# Patient Record
Sex: Female | Born: 1970 | Race: White | Hispanic: No | Marital: Married | Smoking: Former smoker
Health system: Southern US, Community
[De-identification: ages and names within clinical notes are randomized; demographics above are authoritative.]

## PROBLEM LIST (undated history)

## (undated) DIAGNOSIS — J302 Other seasonal allergic rhinitis: Secondary | ICD-10-CM

## (undated) DIAGNOSIS — F909 Attention-deficit hyperactivity disorder, unspecified type: Secondary | ICD-10-CM

## (undated) DIAGNOSIS — F419 Anxiety disorder, unspecified: Secondary | ICD-10-CM

## (undated) DIAGNOSIS — J45909 Unspecified asthma, uncomplicated: Secondary | ICD-10-CM

## (undated) HISTORY — PX: FOOT SURGERY: SHX648

---

## 1998-11-07 ENCOUNTER — Other Ambulatory Visit: Admission: RE | Admit: 1998-11-07 | Discharge: 1998-11-07 | Payer: Self-pay | Admitting: Gynecology

## 2001-04-21 ENCOUNTER — Other Ambulatory Visit: Admission: RE | Admit: 2001-04-21 | Discharge: 2001-04-21 | Payer: Self-pay | Admitting: Gynecology

## 2004-08-28 ENCOUNTER — Ambulatory Visit: Payer: Self-pay | Admitting: Internal Medicine

## 2005-10-28 ENCOUNTER — Ambulatory Visit: Payer: Self-pay | Admitting: Obstetrics and Gynecology

## 2006-04-09 ENCOUNTER — Observation Stay: Payer: Self-pay | Admitting: Obstetrics and Gynecology

## 2006-07-14 ENCOUNTER — Observation Stay: Payer: Self-pay | Admitting: Certified Nurse Midwife

## 2006-07-19 ENCOUNTER — Observation Stay: Payer: Self-pay

## 2006-07-27 ENCOUNTER — Observation Stay: Payer: Self-pay | Admitting: Obstetrics and Gynecology

## 2006-07-27 ENCOUNTER — Inpatient Hospital Stay: Payer: Self-pay | Admitting: Obstetrics and Gynecology

## 2008-02-04 ENCOUNTER — Ambulatory Visit: Payer: Self-pay | Admitting: Obstetrics and Gynecology

## 2009-04-27 ENCOUNTER — Ambulatory Visit: Payer: Self-pay | Admitting: Otolaryngology

## 2009-06-16 ENCOUNTER — Ambulatory Visit: Payer: Self-pay | Admitting: Unknown Physician Specialty

## 2009-06-20 ENCOUNTER — Ambulatory Visit: Payer: Self-pay | Admitting: Unknown Physician Specialty

## 2013-05-31 ENCOUNTER — Other Ambulatory Visit: Payer: Self-pay | Admitting: Orthopaedic Surgery

## 2013-05-31 DIAGNOSIS — M25552 Pain in left hip: Secondary | ICD-10-CM

## 2013-06-09 ENCOUNTER — Other Ambulatory Visit (HOSPITAL_COMMUNITY): Payer: Self-pay | Admitting: Orthopaedic Surgery

## 2013-06-09 ENCOUNTER — Ambulatory Visit (HOSPITAL_COMMUNITY): Admission: RE | Admit: 2013-06-09 | Payer: BC Managed Care – PPO | Source: Ambulatory Visit

## 2013-06-09 ENCOUNTER — Ambulatory Visit (HOSPITAL_COMMUNITY)
Admission: RE | Admit: 2013-06-09 | Discharge: 2013-06-09 | Disposition: A | Discharge status: Home / Self Care | Payer: BC Managed Care – PPO | Source: Ambulatory Visit | Attending: Orthopaedic Surgery | Admitting: Orthopaedic Surgery

## 2013-06-09 DIAGNOSIS — M25559 Pain in unspecified hip: Secondary | ICD-10-CM | POA: Insufficient documentation

## 2013-06-09 DIAGNOSIS — R52 Pain, unspecified: Secondary | ICD-10-CM

## 2013-06-09 DIAGNOSIS — M25552 Pain in left hip: Secondary | ICD-10-CM

## 2013-06-09 MED ORDER — IOHEXOL 300 MG/ML  SOLN
50.0000 mL | Freq: Once | INTRAMUSCULAR | Status: AC | PRN
  Administered 2013-06-09: 9 mL via INTRA_ARTICULAR

## 2013-06-10 ENCOUNTER — Other Ambulatory Visit: Payer: Self-pay

## 2014-06-09 ENCOUNTER — Ambulatory Visit: Payer: Self-pay | Admitting: Obstetrics and Gynecology

## 2014-06-16 ENCOUNTER — Ambulatory Visit: Payer: Self-pay | Admitting: Obstetrics and Gynecology

## 2015-11-22 DIAGNOSIS — F3342 Major depressive disorder, recurrent, in full remission: Secondary | ICD-10-CM | POA: Diagnosis not present

## 2015-11-22 DIAGNOSIS — F900 Attention-deficit hyperactivity disorder, predominantly inattentive type: Secondary | ICD-10-CM | POA: Diagnosis not present

## 2016-01-01 DIAGNOSIS — R030 Elevated blood-pressure reading, without diagnosis of hypertension: Secondary | ICD-10-CM | POA: Diagnosis not present

## 2016-01-01 DIAGNOSIS — J018 Other acute sinusitis: Secondary | ICD-10-CM | POA: Diagnosis not present

## 2016-01-01 DIAGNOSIS — R05 Cough: Secondary | ICD-10-CM | POA: Diagnosis not present

## 2016-01-08 DIAGNOSIS — R197 Diarrhea, unspecified: Secondary | ICD-10-CM | POA: Diagnosis not present

## 2016-01-22 DIAGNOSIS — R197 Diarrhea, unspecified: Secondary | ICD-10-CM | POA: Diagnosis not present

## 2016-02-07 DIAGNOSIS — K580 Irritable bowel syndrome with diarrhea: Secondary | ICD-10-CM | POA: Diagnosis not present

## 2016-02-22 DIAGNOSIS — R197 Diarrhea, unspecified: Secondary | ICD-10-CM | POA: Diagnosis not present

## 2016-02-22 DIAGNOSIS — K52832 Lymphocytic colitis: Secondary | ICD-10-CM | POA: Diagnosis not present

## 2016-02-27 DIAGNOSIS — H5213 Myopia, bilateral: Secondary | ICD-10-CM | POA: Diagnosis not present

## 2016-03-06 DIAGNOSIS — K5289 Other specified noninfective gastroenteritis and colitis: Secondary | ICD-10-CM | POA: Diagnosis not present

## 2016-04-02 DIAGNOSIS — K5289 Other specified noninfective gastroenteritis and colitis: Secondary | ICD-10-CM | POA: Diagnosis not present

## 2016-04-23 ENCOUNTER — Other Ambulatory Visit: Payer: Self-pay | Admitting: Gastroenterology

## 2016-04-23 DIAGNOSIS — K529 Noninfective gastroenteritis and colitis, unspecified: Secondary | ICD-10-CM

## 2016-05-01 ENCOUNTER — Ambulatory Visit
Admission: RE | Admit: 2016-05-01 | Discharge: 2016-05-01 | Disposition: A | Discharge status: Home / Self Care | Payer: BLUE CROSS/BLUE SHIELD | Source: Ambulatory Visit | Attending: Gastroenterology | Admitting: Gastroenterology

## 2016-05-01 DIAGNOSIS — K529 Noninfective gastroenteritis and colitis, unspecified: Secondary | ICD-10-CM | POA: Diagnosis not present

## 2016-05-02 DIAGNOSIS — Z23 Encounter for immunization: Secondary | ICD-10-CM | POA: Diagnosis not present

## 2016-05-08 DIAGNOSIS — K52832 Lymphocytic colitis: Secondary | ICD-10-CM | POA: Diagnosis not present

## 2016-05-22 DIAGNOSIS — F411 Generalized anxiety disorder: Secondary | ICD-10-CM | POA: Diagnosis not present

## 2016-05-22 DIAGNOSIS — F900 Attention-deficit hyperactivity disorder, predominantly inattentive type: Secondary | ICD-10-CM | POA: Diagnosis not present

## 2016-05-27 ENCOUNTER — Other Ambulatory Visit: Payer: Self-pay | Admitting: Obstetrics and Gynecology

## 2016-05-27 DIAGNOSIS — Z1231 Encounter for screening mammogram for malignant neoplasm of breast: Secondary | ICD-10-CM

## 2016-05-27 DIAGNOSIS — N920 Excessive and frequent menstruation with regular cycle: Secondary | ICD-10-CM | POA: Diagnosis not present

## 2016-05-27 DIAGNOSIS — Z01411 Encounter for gynecological examination (general) (routine) with abnormal findings: Secondary | ICD-10-CM | POA: Diagnosis not present

## 2016-05-27 DIAGNOSIS — N750 Cyst of Bartholin's gland: Secondary | ICD-10-CM | POA: Diagnosis not present

## 2016-06-05 ENCOUNTER — Ambulatory Visit
Admission: RE | Admit: 2016-06-05 | Discharge: 2016-06-05 | Disposition: A | Discharge status: Home / Self Care | Payer: BLUE CROSS/BLUE SHIELD | Source: Ambulatory Visit | Attending: Obstetrics and Gynecology | Admitting: Obstetrics and Gynecology

## 2016-06-05 ENCOUNTER — Other Ambulatory Visit: Payer: Self-pay | Admitting: Obstetrics and Gynecology

## 2016-06-05 DIAGNOSIS — Z1231 Encounter for screening mammogram for malignant neoplasm of breast: Secondary | ICD-10-CM

## 2016-06-18 DIAGNOSIS — N920 Excessive and frequent menstruation with regular cycle: Secondary | ICD-10-CM | POA: Diagnosis not present

## 2016-10-24 DIAGNOSIS — J069 Acute upper respiratory infection, unspecified: Secondary | ICD-10-CM | POA: Diagnosis not present

## 2016-10-24 DIAGNOSIS — R6889 Other general symptoms and signs: Secondary | ICD-10-CM | POA: Diagnosis not present

## 2016-10-30 ENCOUNTER — Ambulatory Visit (INDEPENDENT_AMBULATORY_CARE_PROVIDER_SITE_OTHER): Payer: BLUE CROSS/BLUE SHIELD | Admitting: Physician Assistant

## 2016-10-30 ENCOUNTER — Ambulatory Visit (INDEPENDENT_AMBULATORY_CARE_PROVIDER_SITE_OTHER): Payer: BLUE CROSS/BLUE SHIELD

## 2016-10-30 DIAGNOSIS — M79644 Pain in right finger(s): Secondary | ICD-10-CM

## 2016-10-30 DIAGNOSIS — M79645 Pain in left finger(s): Secondary | ICD-10-CM

## 2016-10-30 MED ORDER — DICLOFENAC SODIUM 2 % TD SOLN: TRANSDERMAL | 1 refills | Status: DC

## 2016-10-30 NOTE — Progress Notes (Signed)
   Office Visit Note   Patient: Brittney Alvarez           Date of Birth: 23-Aug-1970           MRN: [id number] Visit Date: 10/30/2016              Requested by: Brittney Haddock, NP [place], [place] [address] PCP: Brittney Haddock, NP   Assessment & Plan: Visit Diagnoses:  1. Pain in left finger(s)     Plan: We will have her apply NSAID to the finger up to 2 mg 4 times a day. I'll up if pain persists or becomes worse.  Follow-Up Instructions: Return if symptoms worsen or fail to improve.   Orders:  Orders Placed This Encounter  Procedures  . XR Finger Middle Right   Meds ordered this encounter  Medications  . Diclofenac Sodium (PENNSAID) 2 % SOLN    Sig: One pump to affected area three times a day    Dispense:  1 Bottle    Refill:  1      Procedures: No procedures performed   Clinical Data: No additional findings.   Subjective: Right middle finger pain  Mrs. Brittney Alvarez comes in today with the aid complaint of right middle finger pain. No injury pains been ongoing for last week or so. She noted some swelling in the finger. She's having no triggering finger. She is recovering from the flu nice any current fevers chills body aches other than the right middle finger  Review of Systems   Objective: Vital Signs: There were no vitals taken for this visit.  Physical Exam  Constitutional: She is oriented to person, place, and time. She appears well-developed and well-nourished. No distress.  Neurological: She is alert and oriented to person, place, and time.    Ortho Exam  bilateral hands she has full motor and full sensation. Radial pulses are 2+ and equal and symmetric. No rashes skin lesions ulcerations erythema or ecchymosis of either hand. Slight edema of the right middle finger at the PIP joint region. Tenderness over the left at the dorsal aspect of the middle finger from the PIP joint to the DIP joint. She has full  motion through the DIP joint and PIP joint of the middle  finger without pain. There is no active triggering of the finger. Remainder of the hand is nontender. No palpable cyst of the right middle finger. No obvious deformity of the right middle finger.  Specialty Comments:  No specialty comments available.  Imaging: Xr Finger Middle Right  Result Date: 10/30/2016 3 views of the right middle finger: No acute fracture. The joint spaces are all well maintained. No foreign body or bony abnormalities seen.    PMFS History: There are no active problems to display for this patient.  No past medical history on file.  No family history on file.  No past surgical history on file. Social History   Occupational History  . Not on file.   Social History Main Topics  . Smoking status: Not on file  . Smokeless tobacco: Not on file  . Alcohol use Not on file  . Drug use: Unknown  . Sexual activity: Not on file

## 2017-01-07 DIAGNOSIS — L820 Inflamed seborrheic keratosis: Secondary | ICD-10-CM | POA: Diagnosis not present

## 2017-01-07 DIAGNOSIS — L814 Other melanin hyperpigmentation: Secondary | ICD-10-CM | POA: Diagnosis not present

## 2017-01-07 DIAGNOSIS — L821 Other seborrheic keratosis: Secondary | ICD-10-CM | POA: Diagnosis not present

## 2017-01-07 DIAGNOSIS — D225 Melanocytic nevi of trunk: Secondary | ICD-10-CM | POA: Diagnosis not present

## 2017-01-07 DIAGNOSIS — L2084 Intrinsic (allergic) eczema: Secondary | ICD-10-CM | POA: Diagnosis not present

## 2017-02-26 DIAGNOSIS — F411 Generalized anxiety disorder: Secondary | ICD-10-CM | POA: Diagnosis not present

## 2017-02-26 DIAGNOSIS — F900 Attention-deficit hyperactivity disorder, predominantly inattentive type: Secondary | ICD-10-CM | POA: Diagnosis not present

## 2017-03-10 DIAGNOSIS — D235 Other benign neoplasm of skin of trunk: Secondary | ICD-10-CM | POA: Diagnosis not present

## 2017-03-10 DIAGNOSIS — D225 Melanocytic nevi of trunk: Secondary | ICD-10-CM | POA: Diagnosis not present

## 2017-03-28 ENCOUNTER — Encounter: Payer: Self-pay | Admitting: Podiatry

## 2017-03-28 ENCOUNTER — Ambulatory Visit (INDEPENDENT_AMBULATORY_CARE_PROVIDER_SITE_OTHER): Payer: BLUE CROSS/BLUE SHIELD | Admitting: Podiatry

## 2017-03-28 ENCOUNTER — Ambulatory Visit (INDEPENDENT_AMBULATORY_CARE_PROVIDER_SITE_OTHER): Payer: BLUE CROSS/BLUE SHIELD

## 2017-03-28 DIAGNOSIS — M722 Plantar fascial fibromatosis: Secondary | ICD-10-CM

## 2017-03-28 DIAGNOSIS — M779 Enthesopathy, unspecified: Secondary | ICD-10-CM | POA: Diagnosis not present

## 2017-03-28 MED ORDER — DICLOFENAC SODIUM 75 MG PO TBEC: 75.0000 mg | DELAYED_RELEASE_TABLET | Freq: Two times a day (BID) | ORAL | 0 refills | Status: DC

## 2017-03-28 MED ORDER — TRIAMCINOLONE ACETONIDE 10 MG/ML IJ SUSP
10.0000 mg | Freq: Once | INTRAMUSCULAR | Status: AC
  Administered 2017-03-28: 10 mg

## 2017-03-28 MED ORDER — DICLOFENAC SODIUM 75 MG PO TBEC: 75.0000 mg | DELAYED_RELEASE_TABLET | Freq: Two times a day (BID) | ORAL | 2 refills | Status: DC

## 2017-03-28 MED ORDER — TRIAMCINOLONE ACETONIDE 10 MG/ML IJ SUSP: 10.0000 mg | Freq: Once | INTRAMUSCULAR | Status: DC

## 2017-03-30 NOTE — Progress Notes (Signed)
Subjective:    Patient ID: Brittney Alvarez, female   DOB: 46 y.o.   MRN: [id number]   HPI patient presents with pain in the right heel and also in the right ankle. Not sure which started first and it's been going on for short period of time she's had a, mild history of plantar fasciitis in the past.    Review of Systems  All other systems reviewed and are negative.       Objective:  Physical Exam  Constitutional: She appears well-developed and well-nourished.  Cardiovascular: Intact distal pulses.   Musculoskeletal: Normal range of motion.  Neurological: She is alert.  Skin: Skin is warm.  Nursing note and vitals reviewed.  neurovascular status was found to be intact with muscle strength adequate range of motion within normal limits. Patient was noted to have discomfort of an exquisite nature in the right sinus tarsi with inflammation fluid and also moderate discomfort in the plantar heel right and the calcaneal bone itself with minimal edema noted around this area. Patient has good digital perfusion well oriented 3     Assessment:  Possibility for inflammatory fasciitis causing change in gait leading to significant sinus tarsitis right foot. Patient also has a very small chance for calcaneal fracture but does not show significant symptoms of this       Plan:   H&P x-rays reviewed and at this time we'll focus on the sinus tarsi and then we will consider other treatment if the other symptoms persist. I did a sinus tarsi injection 3 mg Kenalog 5 mill grams Xylocaine directly into the subtalar joint I applied fascial brace to immobilize the foot advised on ice therapy placed on diclofenac 75 mg twice a day and reappoint if symptoms were to continue  X-rays were negative for signs of arthritis or spurring or what appears to be any form of calcaneal fracture currently

## 2017-05-03 DIAGNOSIS — Z23 Encounter for immunization: Secondary | ICD-10-CM | POA: Diagnosis not present

## 2017-07-21 ENCOUNTER — Ambulatory Visit: Payer: BLUE CROSS/BLUE SHIELD | Admitting: Podiatry

## 2017-07-21 ENCOUNTER — Ambulatory Visit (INDEPENDENT_AMBULATORY_CARE_PROVIDER_SITE_OTHER): Payer: BLUE CROSS/BLUE SHIELD

## 2017-07-21 ENCOUNTER — Other Ambulatory Visit: Payer: Self-pay | Admitting: Podiatry

## 2017-07-21 ENCOUNTER — Encounter: Payer: Self-pay | Admitting: Podiatry

## 2017-07-21 DIAGNOSIS — M779 Enthesopathy, unspecified: Secondary | ICD-10-CM

## 2017-07-21 DIAGNOSIS — M79672 Pain in left foot: Secondary | ICD-10-CM

## 2017-07-21 MED ORDER — TRIAMCINOLONE ACETONIDE 10 MG/ML IJ SUSP: 10.0000 mg | Freq: Once | INTRAMUSCULAR | Status: DC

## 2017-07-21 NOTE — Progress Notes (Signed)
Subjective:   Patient ID: Brittney Alvarez, female   DOB: 46 y.o.   MRN: [id number]   HPI Patient presents stating she's been getting a lot of pain in her left foot and it's gradually becoming more irritated. States it's been this way for about a week and gradually becoming more of an issue for her and that she has tried wider shoes without relief   ROS      Objective:  Physical Exam  Neurovascular status intact negative Homans sign was noted with patient's left foot showing inflammation around the proximal hallux and just distal to the first MPJ. There is no loss of motion or crepitus noted within the joint     Assessment:  Inflammatory condition with inflammation around the first MPJ medial side     Plan:  H&P condition reviewed and today reviewed x-ray and then carefully injected the medial side 3 mg Kenalog 5 mill grams Xylocaine and applied cushion take pressure off the toe. Reappoint for [place] to recheck  X-ray indicates moderate bunion but no indication of other pathology

## 2017-08-25 DIAGNOSIS — F419 Anxiety disorder, unspecified: Secondary | ICD-10-CM | POA: Diagnosis not present

## 2017-08-25 DIAGNOSIS — Z6829 Body mass index (BMI) 29.0-29.9, adult: Secondary | ICD-10-CM | POA: Diagnosis not present

## 2017-08-25 DIAGNOSIS — R51 Headache: Secondary | ICD-10-CM | POA: Diagnosis not present

## 2017-10-09 ENCOUNTER — Encounter: Payer: Self-pay | Admitting: Neurology

## 2017-10-09 DIAGNOSIS — Z6829 Body mass index (BMI) 29.0-29.9, adult: Secondary | ICD-10-CM | POA: Diagnosis not present

## 2017-10-09 DIAGNOSIS — R51 Headache: Secondary | ICD-10-CM | POA: Diagnosis not present

## 2017-10-13 DIAGNOSIS — R51 Headache: Secondary | ICD-10-CM | POA: Diagnosis not present

## 2017-11-05 DIAGNOSIS — F900 Attention-deficit hyperactivity disorder, predominantly inattentive type: Secondary | ICD-10-CM | POA: Diagnosis not present

## 2017-11-19 ENCOUNTER — Encounter (INDEPENDENT_AMBULATORY_CARE_PROVIDER_SITE_OTHER): Payer: Self-pay | Admitting: Orthopaedic Surgery

## 2017-11-19 ENCOUNTER — Ambulatory Visit (INDEPENDENT_AMBULATORY_CARE_PROVIDER_SITE_OTHER): Payer: BLUE CROSS/BLUE SHIELD | Admitting: Orthopaedic Surgery

## 2017-11-19 DIAGNOSIS — M7711 Lateral epicondylitis, right elbow: Secondary | ICD-10-CM | POA: Diagnosis not present

## 2017-11-19 MED ORDER — LIDOCAINE HCL 1 % IJ SOLN
1.0000 mL | INTRAMUSCULAR | Status: AC | PRN
  Administered 2017-11-19: 1 mL

## 2017-11-19 MED ORDER — DICLOFENAC SODIUM 75 MG PO TBEC: 75.0000 mg | DELAYED_RELEASE_TABLET | Freq: Two times a day (BID) | ORAL | 1 refills | Status: DC | PRN

## 2017-11-19 MED ORDER — METHYLPREDNISOLONE ACETATE 40 MG/ML IJ SUSP
40.0000 mg | INTRAMUSCULAR | Status: AC | PRN
  Administered 2017-11-19: 40 mg

## 2017-11-19 NOTE — Progress Notes (Signed)
Office Visit Note   Patient: Brittney Alvarez           Date of Birth: [birth year]           MRN: [id number] Visit Date: 11/19/2017              Requested by: Kathrine Haddock, NP [address], [place] [address] PCP: Kathrine Haddock, NP   Assessment & Plan: Visit Diagnoses:  1. Right lateral epicondylitis     Plan: Her clinical exam is definitely consistent with lateral epicondylitis of the right elbow.  She is going to try to work on activity modification with avoiding a pronated position for long periods of time.  We will try diclofenac as an anti-inflammatory as well as give her some samples of pennsaid.  Given the severity of her pain we offered her a steroid injection of the lateral epicondyle area and she agreed to this.  She understands the risk and benefits of injections.  She tolerated it well.  Also showed her stretching exercises want to try.  She understands this take a while for it to feel better.  She also has a tennis elbow strap.  All questions concerns were answered and assessed.  She will follow-up as needed.  Follow-Up Instructions: Return if symptoms worsen or fail to improve.   Orders:  Orders Placed This Encounter  Procedures  . Hand/UE Inj   Meds ordered this encounter  Medications  . diclofenac (VOLTAREN) 75 MG EC tablet    Sig: Take 1 tablet (75 mg total) by mouth 2 (two) times daily between meals as needed.    Dispense:  60 tablet    Refill:  1      Procedures: Hand/UE Inj: R elbow for lateral epicondylitis on 11/19/2017 8:47 AM Medications: 1 mL lidocaine 1 %; 40 mg methylPREDNISolone acetate 40 MG/ML      Clinical Data: No additional findings.   Subjective: No chief complaint on file. Patient comes in today with chief complaint of right elbow pain and she points to the lateral epicondyle area source of her elbow.  She says she uses a mouse at work all day long with her hand in a pronated position it causes significant pain.  She is on recently switched  her mouse.  She denies any numbness and tingling in her hand.  She denies any injuries.  She is right-hand dominant.  HPI  Review of Systems She denies any headache, chest pain, shortness of breath, fever, chills, nausea, vomiting.  Objective: Vital Signs: There were no vitals taken for this visit.  Physical Exam She is alert and oriented x3 and in no acute distress Ortho Exam Examination of her right elbow shows fluid and full range of motion of the elbow.  There is no effusion at all.  She has significant pain over the lateral epicondylar area to palpation.  Grip strength is normal in her hand and wrist exam are normal. Specialty Comments:  No specialty comments available.  Imaging: No results found.   PMFS History: Patient Active Problem List   Diagnosis Date Noted  . Right lateral epicondylitis 11/19/2017   History reviewed. No pertinent past medical history.  History reviewed. No pertinent family history.  History reviewed. No pertinent surgical history. Social History   Occupational History  . Not on file  Tobacco Use  . Smoking status: Unknown If Ever Smoked  . Smokeless tobacco: Never Used  Substance and Sexual Activity  . Alcohol use: Not on file  . Drug  use: Not on file  . Sexual activity: Not on file

## 2017-11-20 ENCOUNTER — Other Ambulatory Visit: Payer: Self-pay | Admitting: Unknown Physician Specialty

## 2017-11-20 DIAGNOSIS — R05 Cough: Secondary | ICD-10-CM | POA: Diagnosis not present

## 2017-11-20 DIAGNOSIS — M5481 Occipital neuralgia: Secondary | ICD-10-CM

## 2017-11-20 DIAGNOSIS — R51 Headache: Secondary | ICD-10-CM | POA: Diagnosis not present

## 2017-12-01 ENCOUNTER — Ambulatory Visit: Payer: BLUE CROSS/BLUE SHIELD

## 2017-12-12 ENCOUNTER — Encounter: Payer: Self-pay | Admitting: Neurology

## 2017-12-29 ENCOUNTER — Other Ambulatory Visit: Payer: Self-pay | Admitting: Nurse Practitioner

## 2017-12-29 DIAGNOSIS — M5481 Occipital neuralgia: Secondary | ICD-10-CM | POA: Diagnosis not present

## 2017-12-29 DIAGNOSIS — M542 Cervicalgia: Secondary | ICD-10-CM

## 2017-12-29 DIAGNOSIS — E559 Vitamin D deficiency, unspecified: Secondary | ICD-10-CM | POA: Diagnosis not present

## 2017-12-31 DIAGNOSIS — H5213 Myopia, bilateral: Secondary | ICD-10-CM | POA: Diagnosis not present

## 2017-12-31 DIAGNOSIS — D485 Neoplasm of uncertain behavior of skin: Secondary | ICD-10-CM | POA: Diagnosis not present

## 2018-01-06 DIAGNOSIS — D1801 Hemangioma of skin and subcutaneous tissue: Secondary | ICD-10-CM | POA: Diagnosis not present

## 2018-01-06 DIAGNOSIS — L814 Other melanin hyperpigmentation: Secondary | ICD-10-CM | POA: Diagnosis not present

## 2018-01-06 DIAGNOSIS — L821 Other seborrheic keratosis: Secondary | ICD-10-CM | POA: Diagnosis not present

## 2018-01-06 DIAGNOSIS — D225 Melanocytic nevi of trunk: Secondary | ICD-10-CM | POA: Diagnosis not present

## 2018-01-08 ENCOUNTER — Ambulatory Visit
Admission: RE | Admit: 2018-01-08 | Discharge: 2018-01-08 | Disposition: A | Discharge status: Home / Self Care | Payer: BLUE CROSS/BLUE SHIELD | Source: Ambulatory Visit | Attending: Nurse Practitioner | Admitting: Nurse Practitioner

## 2018-01-08 DIAGNOSIS — M542 Cervicalgia: Secondary | ICD-10-CM | POA: Diagnosis not present

## 2018-01-08 DIAGNOSIS — E559 Vitamin D deficiency, unspecified: Secondary | ICD-10-CM | POA: Diagnosis not present

## 2018-01-08 MED ORDER — GADOBENATE DIMEGLUMINE 529 MG/ML IV SOLN
19.0000 mL | Freq: Once | INTRAVENOUS | Status: AC | PRN
  Administered 2018-01-08: 20 mL via INTRAVENOUS

## 2018-01-27 ENCOUNTER — Ambulatory Visit: Payer: Self-pay | Admitting: Neurology

## 2018-01-30 DIAGNOSIS — M5481 Occipital neuralgia: Secondary | ICD-10-CM | POA: Diagnosis not present

## 2018-01-30 DIAGNOSIS — E559 Vitamin D deficiency, unspecified: Secondary | ICD-10-CM | POA: Diagnosis not present

## 2018-01-30 DIAGNOSIS — M542 Cervicalgia: Secondary | ICD-10-CM | POA: Diagnosis not present

## 2018-02-02 DIAGNOSIS — M5481 Occipital neuralgia: Secondary | ICD-10-CM | POA: Diagnosis not present

## 2018-04-08 DIAGNOSIS — Z1211 Encounter for screening for malignant neoplasm of colon: Secondary | ICD-10-CM | POA: Diagnosis not present

## 2018-04-08 DIAGNOSIS — Z124 Encounter for screening for malignant neoplasm of cervix: Secondary | ICD-10-CM | POA: Diagnosis not present

## 2018-04-08 DIAGNOSIS — Z1239 Encounter for other screening for malignant neoplasm of breast: Secondary | ICD-10-CM | POA: Diagnosis not present

## 2018-04-08 DIAGNOSIS — E669 Obesity, unspecified: Secondary | ICD-10-CM | POA: Diagnosis not present

## 2018-04-08 DIAGNOSIS — Z01419 Encounter for gynecological examination (general) (routine) without abnormal findings: Secondary | ICD-10-CM | POA: Diagnosis not present

## 2018-04-09 DIAGNOSIS — Z01419 Encounter for gynecological examination (general) (routine) without abnormal findings: Secondary | ICD-10-CM | POA: Diagnosis not present

## 2018-04-09 DIAGNOSIS — E669 Obesity, unspecified: Secondary | ICD-10-CM | POA: Diagnosis not present

## 2018-04-29 DIAGNOSIS — F900 Attention-deficit hyperactivity disorder, predominantly inattentive type: Secondary | ICD-10-CM | POA: Diagnosis not present

## 2018-05-04 DIAGNOSIS — M542 Cervicalgia: Secondary | ICD-10-CM | POA: Diagnosis not present

## 2018-05-04 DIAGNOSIS — G4719 Other hypersomnia: Secondary | ICD-10-CM | POA: Diagnosis not present

## 2018-05-04 DIAGNOSIS — M5481 Occipital neuralgia: Secondary | ICD-10-CM | POA: Diagnosis not present

## 2018-05-04 DIAGNOSIS — E559 Vitamin D deficiency, unspecified: Secondary | ICD-10-CM | POA: Diagnosis not present

## 2018-05-15 ENCOUNTER — Encounter: Payer: Self-pay | Admitting: Podiatry

## 2018-05-15 ENCOUNTER — Other Ambulatory Visit: Payer: Self-pay | Admitting: Podiatry

## 2018-05-15 ENCOUNTER — Ambulatory Visit (INDEPENDENT_AMBULATORY_CARE_PROVIDER_SITE_OTHER): Payer: BLUE CROSS/BLUE SHIELD

## 2018-05-15 ENCOUNTER — Ambulatory Visit: Payer: BLUE CROSS/BLUE SHIELD | Admitting: Podiatry

## 2018-05-15 DIAGNOSIS — M779 Enthesopathy, unspecified: Secondary | ICD-10-CM

## 2018-05-15 DIAGNOSIS — M79675 Pain in left toe(s): Secondary | ICD-10-CM

## 2018-05-15 MED ORDER — TRIAMCINOLONE ACETONIDE 10 MG/ML IJ SUSP
10.0000 mg | Freq: Once | INTRAMUSCULAR | Status: AC
  Administered 2018-05-15: 10 mg

## 2018-05-18 NOTE — Progress Notes (Signed)
Subjective:   Patient ID: Brittney Alvarez, female   DOB: 47 y.o.   MRN: [id number]   HPI Patient presents with reoccurrence of pain in the left first metatarsal distal to the metatarsal head base of proximal phalanx medial side that is inflamed and painful when palpated.  It is localized to this area with fluid buildup and appears to be the same problem she had last year   ROS      Objective:  Physical Exam  Neurovascular status intact with inflammation base of left hallux left with fluid buildup and what appears to be a prominence of the bone in this area     Assessment:  Probability for bone spur with inflammatory changes of the base of the left hallux with possibility that there may be nerve entrapment also associated with this     Plan:  H&P x-ray taken and today added careful injection of this area 3 mg Kenalog 5 Milgram Xylocaine to reduce the inflammation.  I explained to her that ultimately this may require surgical intervention but at this point she did well with that conservatively for almost a year so we will follow this and see how it does  X-ray indicates there is some prominence in the area but no indications of out right bone spur formation

## 2018-06-17 DIAGNOSIS — F900 Attention-deficit hyperactivity disorder, predominantly inattentive type: Secondary | ICD-10-CM | POA: Diagnosis not present

## 2018-06-17 DIAGNOSIS — F4322 Adjustment disorder with anxiety: Secondary | ICD-10-CM | POA: Diagnosis not present

## 2018-12-15 DIAGNOSIS — G5630 Lesion of radial nerve, unspecified upper limb: Secondary | ICD-10-CM | POA: Insufficient documentation

## 2019-04-09 ENCOUNTER — Ambulatory Visit (INDEPENDENT_AMBULATORY_CARE_PROVIDER_SITE_OTHER): Payer: 59

## 2019-04-09 ENCOUNTER — Ambulatory Visit (INDEPENDENT_AMBULATORY_CARE_PROVIDER_SITE_OTHER): Payer: 59 | Admitting: Podiatry

## 2019-04-09 ENCOUNTER — Other Ambulatory Visit: Payer: Self-pay

## 2019-04-09 ENCOUNTER — Encounter: Payer: Self-pay | Admitting: Podiatry

## 2019-04-09 ENCOUNTER — Other Ambulatory Visit: Payer: Self-pay | Admitting: Podiatry

## 2019-04-09 VITALS — Temp 98.2°F

## 2019-04-09 DIAGNOSIS — N920 Excessive and frequent menstruation with regular cycle: Secondary | ICD-10-CM | POA: Insufficient documentation

## 2019-04-09 DIAGNOSIS — F988 Other specified behavioral and emotional disorders with onset usually occurring in childhood and adolescence: Secondary | ICD-10-CM | POA: Insufficient documentation

## 2019-04-09 DIAGNOSIS — M21612 Bunion of left foot: Secondary | ICD-10-CM

## 2019-04-09 DIAGNOSIS — M21619 Bunion of unspecified foot: Secondary | ICD-10-CM | POA: Diagnosis not present

## 2019-04-09 DIAGNOSIS — D169 Benign neoplasm of bone and articular cartilage, unspecified: Secondary | ICD-10-CM

## 2019-04-09 DIAGNOSIS — M79675 Pain in left toe(s): Secondary | ICD-10-CM | POA: Diagnosis not present

## 2019-04-09 NOTE — Progress Notes (Signed)
Subjective:   Patient ID: Brittney Alvarez, female   DOB: 48 y.o.   MRN: [id number]   HPI Patient presents stating that this big toe joint is really bother me again and I am getting the same irritation of the nerve and it feels like it is pressing against the bone and all different types of shoes seem to bother it   ROS      Objective:  Physical Exam  Neurovascular status intact with patient found to have moderate prominence of the first metatarsal head left with redness and discomfort and redness which extends slightly distal from this.  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  Organ to try to just smooth the bone and I am relatively confident this will solve her problem and today I went ahead and allowed her to read consent form going over all possible complications as outlined and the fact there is no long-term guarantee of surgery.  Patient wants procedure and at this time signed consent form understanding risk and alternative treatments and is scheduled for outpatient surgery in an air fracture walker dispensed with all instructions on usage and is encouraged to call with questions concerns  X-rays indicate that there is moderate structural bunion deformity left with roughness of the head of the first metatarsal

## 2019-04-09 NOTE — Patient Instructions (Signed)
Pre-Operative Instructions  Congratulations, you have decided to take an important step towards improving your quality of life.  You can be assured that the doctors and staff at [facility] will be with you every step of the way.  Here are some important things you should know:  1. Plan to be at the surgery center/hospital at least 1 (one) hour prior to your scheduled time, unless otherwise directed by the surgical center/hospital staff.  You must have a responsible adult accompany you, remain during the surgery and drive you home.  Make sure you have directions to the surgical center/hospital to ensure you arrive on time. 2. If you are having surgery at [facility] or [facility], you will need a copy of your medical history and physical form from your family physician within one month prior to the date of surgery. We will give you a form for your primary physician to complete.  3. We make every effort to accommodate the date you request for surgery.  However, there are times where surgery dates or times have to be moved.  We will contact you as soon as possible if a change in schedule is required.   4. No aspirin/ibuprofen for one week before surgery.  If you are on aspirin, any non-steroidal anti-inflammatory medications (Mobic, Aleve, Ibuprofen) should not be taken seven (7) days prior to your surgery.  You make take Tylenol for pain prior to surgery.  5. Medications - If you are taking daily heart and blood pressure medications, seizure, reflux, allergy, asthma, anxiety, pain or diabetes medications, make sure you notify the surgery center/hospital before the day of surgery so they can tell you which medications you should take or avoid the day of surgery. 6. No food or drink after midnight the night before surgery unless directed otherwise by surgical center/hospital staff. 7. No alcoholic beverages 24-hours prior to surgery.  No smoking 24-hours prior or 24-hours after  surgery. 8. Wear loose pants or shorts. They should be loose enough to fit over bandages, boots, and casts. 9. Don't wear slip-on shoes. Sneakers are preferred. 10. Bring your boot with you to the surgery center/hospital.  Also bring crutches or a walker if your physician has prescribed it for you.  If you do not have this equipment, it will be provided for you after surgery. 11. If you have not been contacted by the surgery center/hospital by the day before your surgery, call to confirm the date and time of your surgery. 12. Leave-time from work may vary depending on the type of surgery you have.  Appropriate arrangements should be made prior to surgery with your employer. 13. Prescriptions will be provided immediately following surgery by your doctor.  Fill these as soon as possible after surgery and take the medication as directed. Pain medications will not be refilled on weekends and must be approved by the doctor. 14. Remove nail polish on the operative foot and avoid getting pedicures prior to surgery. 15. Wash the night before surgery.  The night before surgery wash the foot and leg well with water and the antibacterial soap provided. Be sure to pay special attention to beneath the toenails and in between the toes.  Wash for at least three (3) minutes. Rinse thoroughly with water and dry well with a towel.  Perform this wash unless told not to do so by your physician.  Enclosed: 1 Ice pack (please put in freezer the night before surgery)   1 Hibiclens skin cleaner     Pre-op instructions  If you have any questions regarding the instructions, please do not hesitate to call our office.  [place]: [address], [place], [place] [address] -- [phone]  [place]: [address] [place], [place] [address] -- [phone]  [place]: [address]  [place], [place] [address] -- [phone]  [place]: [address], [address], [place], [place] [address] -- [phone]  Website:  [email or web address] 

## 2019-04-22 ENCOUNTER — Telehealth: Payer: Self-pay | Admitting: *Deleted

## 2019-04-22 NOTE — Telephone Encounter (Signed)
"  We've been trying to reach [facility].  She's scheduled for Tuesday, September 8.  Do you have a different number for her?"  Try these numbers.  Her emails is [email or web address] Alvarez  [phone]      Beaudry,[name] [phone]

## 2019-04-23 ENCOUNTER — Telehealth: Payer: Self-pay | Admitting: *Deleted

## 2019-04-23 NOTE — Telephone Encounter (Addendum)
DOS 04/27/2019 [facility] - ID:[id number] AND HAMMER TOE REPAIR - [id number] OF THE LEFT FOOT  [employer]: Effective Date - 02/17/2019  Deductible - $3000  Co-insurance - 80% / 20% Out of pocket - $4000  Transferred me to prior authorization dept.  Spoke to [employer]. Pre-certification is NOT REQUIRED. Don't use reference numbers.  Calls regarded by date and time. 04/23/2019 12:26 pm.

## 2019-04-27 ENCOUNTER — Encounter: Payer: Self-pay | Admitting: Podiatry

## 2019-04-27 DIAGNOSIS — M2012 Hallux valgus (acquired), left foot: Secondary | ICD-10-CM

## 2019-04-27 DIAGNOSIS — D1632 Benign neoplasm of short bones of left lower limb: Secondary | ICD-10-CM | POA: Diagnosis not present

## 2019-04-27 DIAGNOSIS — M85672 Other cyst of bone, left ankle and foot: Secondary | ICD-10-CM

## 2019-05-05 ENCOUNTER — Encounter: Payer: Self-pay | Admitting: Podiatry

## 2019-05-05 ENCOUNTER — Other Ambulatory Visit: Payer: Self-pay

## 2019-05-05 ENCOUNTER — Ambulatory Visit (INDEPENDENT_AMBULATORY_CARE_PROVIDER_SITE_OTHER): Payer: 59 | Admitting: Podiatry

## 2019-05-05 ENCOUNTER — Ambulatory Visit (INDEPENDENT_AMBULATORY_CARE_PROVIDER_SITE_OTHER): Payer: 59

## 2019-05-05 VITALS — Temp 97.5°F

## 2019-05-05 DIAGNOSIS — M21619 Bunion of unspecified foot: Secondary | ICD-10-CM

## 2019-05-05 DIAGNOSIS — Z09 Encounter for follow-up examination after completed treatment for conditions other than malignant neoplasm: Secondary | ICD-10-CM

## 2019-05-05 DIAGNOSIS — M21612 Bunion of left foot: Secondary | ICD-10-CM

## 2019-05-05 NOTE — Progress Notes (Signed)
Subjective:   Patient ID: Brittney Alvarez, female   DOB: 48 y.o.   MRN: [id number]   HPI Patient states I seem to be feeling good now I had quite a bit of pain for a couple days after surgery but much improved at this time   ROS      Objective:  Physical Exam  Neurovascular status intact negative Homans sign noted with patient's left foot healing well wound edges well coapted hallux in rectus position range of motion good for this.  Postop     Assessment:  Doing well post osteotomy exostectomy left first metatarsal left hallux     Plan:  H&P done condition reviewed recommended continued elevation immobilization compression and reappoint to recheck again for regular visit or earlier if needed with surgical shoe being dispensed along with Ace wrap.  Gave instructions on range of motion to be done at this time  X-ray shows excellent reduction of intermetatarsal angle with slight stress on the osteotomy so advised patient on the importance of continued immobilization.  Should heal uneventfully but we will keep an eye on it

## 2019-05-10 IMAGING — MR MR CERVICAL SPINE WO/W CM
8 series · 41 of 48 positions shown · IV contrast (multihance)
Comparison: None.

CLINICAL DATA: Headaches since [REDACTED], neck pain.

EXAM:
MRI CERVICAL SPINE WITHOUT AND WITH CONTRAST
TECHNIQUE: Multiplanar and multiecho pulse sequences of the cervical spine, to
include the craniocervical junction and cervicothoracic junction,
were obtained without and with intravenous contrast.
CONTRAST:  20mL MULTIHANCE GADOBENATE DIMEGLUMINE 529 MG/ML IV SOLN

[Series 2: T2 · sagittal · 3.0mm · 0.56mm/px · 4 of 13 slices shown (1 of 2)]
[im 1/13]
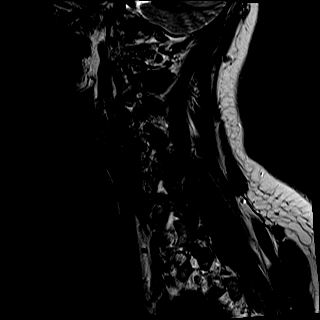
[im 5/13]
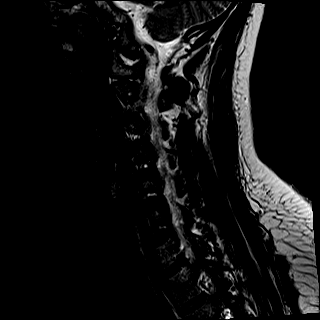
[im 9/13]
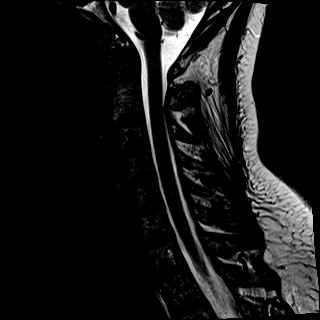
[im 13/13]
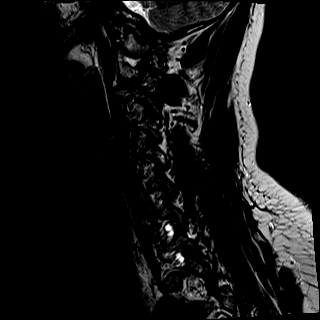

[Series 3: T1 · sagittal · 3.0mm · 0.70mm/px · 4 of 13 slices shown (1 of 2)]
[im 1/13]
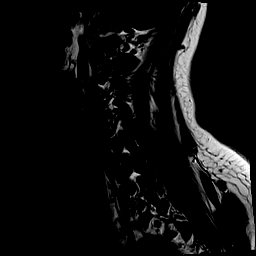
[im 5/13]
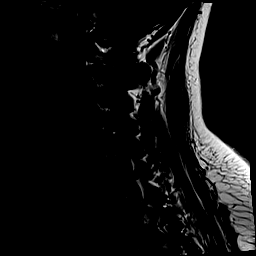
[im 9/13]
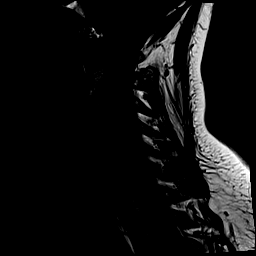
[im 13/13]
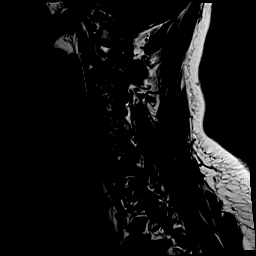

[Series 4: STIR · sagittal · 3.0mm · 0.35mm/px · 4 of 13 slices shown]
[im 1/13]
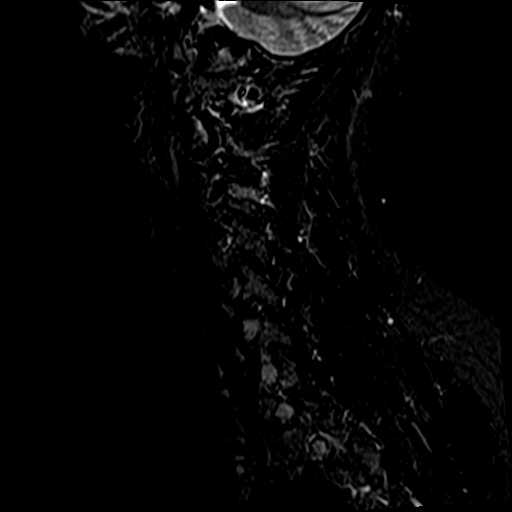
[im 5/13]
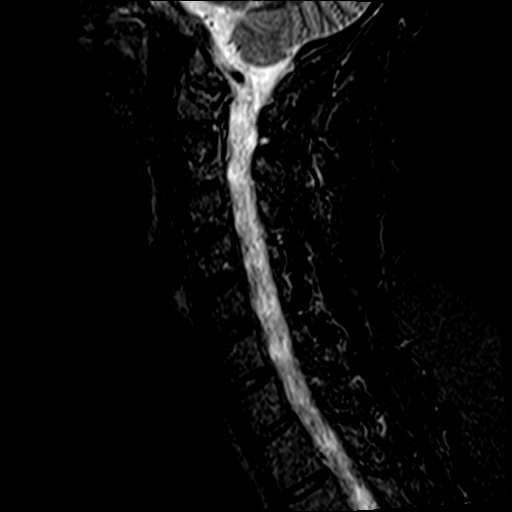
[im 9/13]
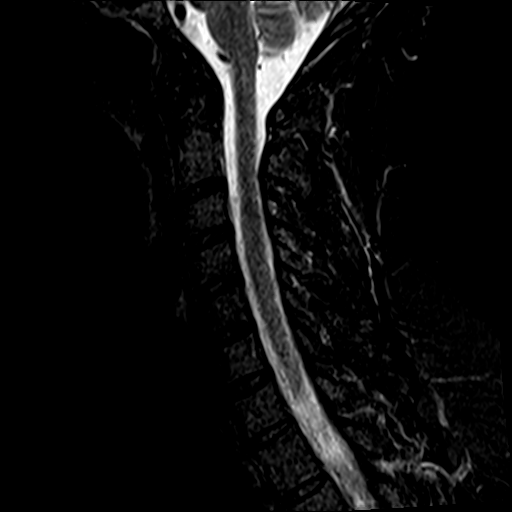
[im 13/13]
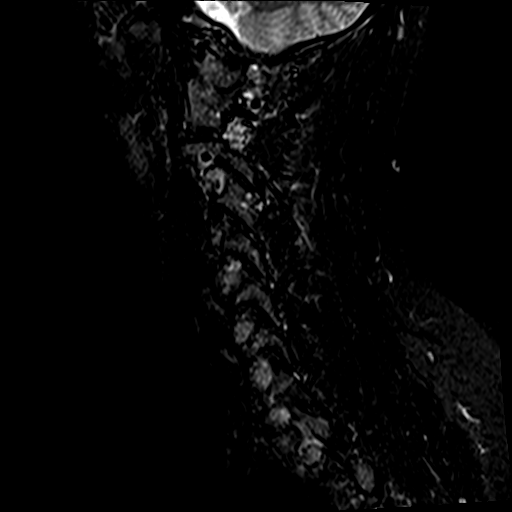

[Series 5: T2 · axial · 3.0mm · 0.62mm/px · z∈[-108,-11]mm · 8 of 28 slices shown (2 of 2)]
[im 1/28]
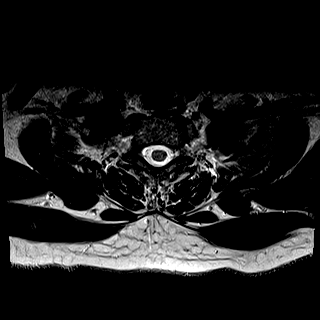
[im 4/28]
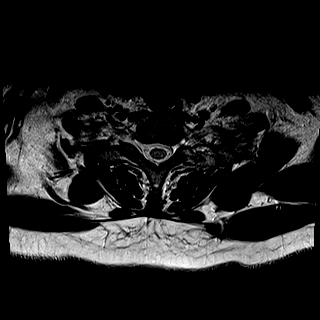
[im 8/28]
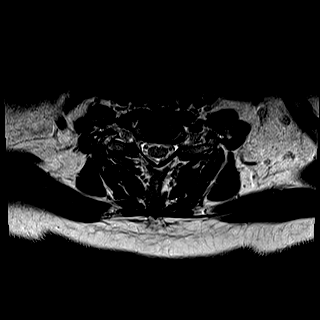
[im 12/28]
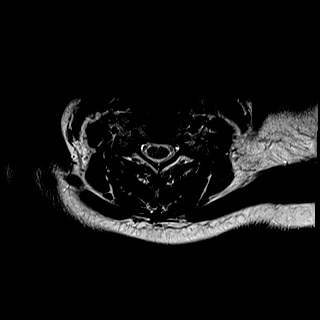
[im 16/28]
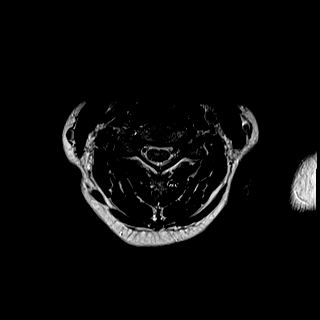
[im 20/28]
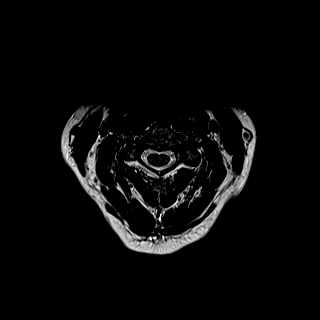
[im 24/28]
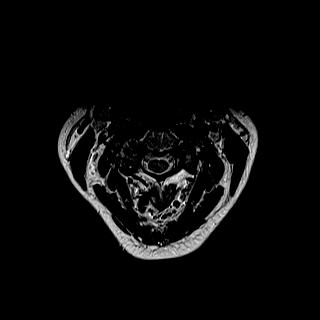
[im 28/28]
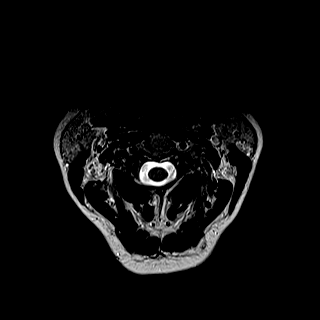

[Series 6: mpgr ax · axial · 3.0mm · 0.35mm/px · 1 of 28 slices shown]
[im 1/28]
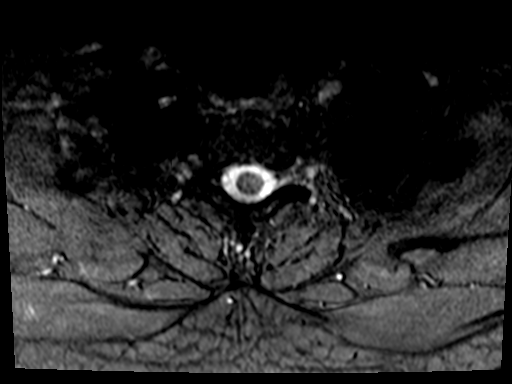

[Series 7: T1 · axial · non-contrast · 3.0mm · 0.62mm/px · z∈[-108,-11]mm · 8 of 28 slices shown (2 of 2)]
[im 1/28]
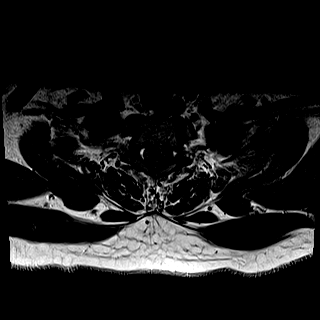
[im 4/28]
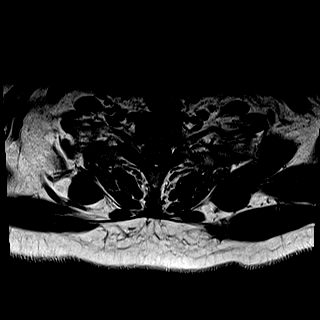
[im 8/28]
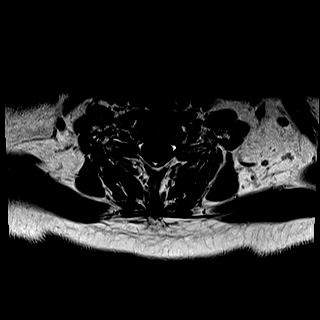
[im 12/28]
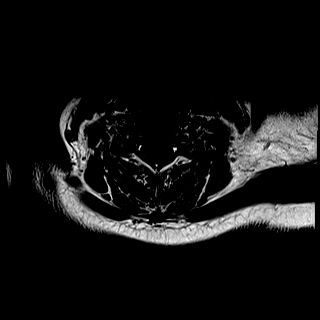
[im 16/28]
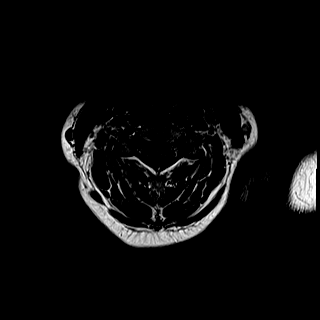
[im 20/28]
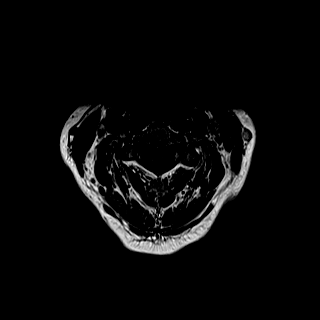
[im 24/28]
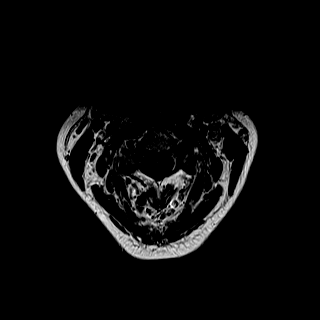
[im 28/28]
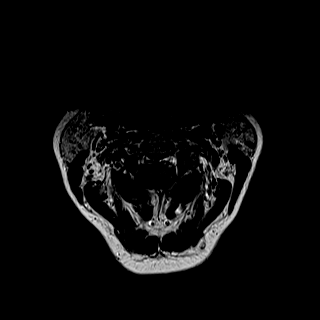

[Series 8: T1 fat-sat post-contrast · sagittal · 3.0mm · 0.56mm/px · 4 of 13 slices shown]
[im 1/13]
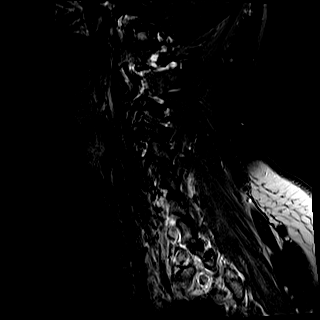
[im 5/13]
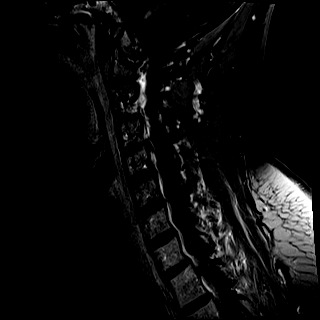
[im 9/13]
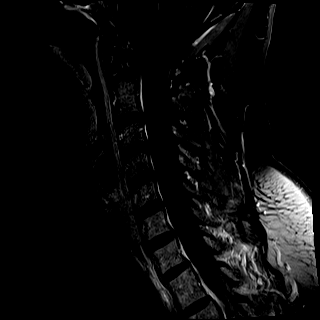
[im 13/13]
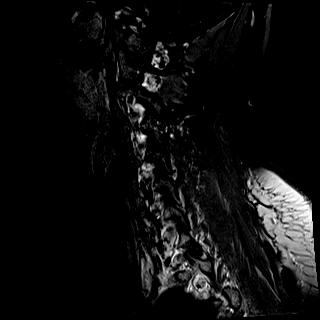

[Series 9: T1 post-contrast · axial · 3.0mm · 0.62mm/px · z∈[-108,-11]mm · 8 of 28 slices shown]
[im 1/28]
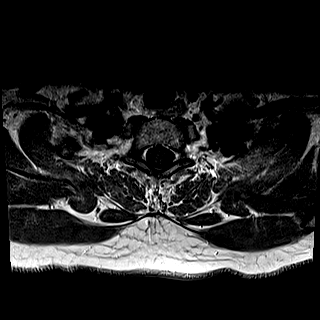
[im 4/28]
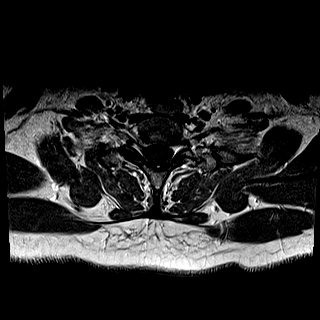
[im 8/28]
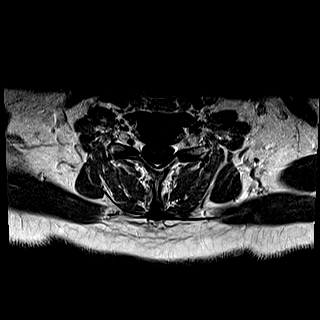
[im 12/28]
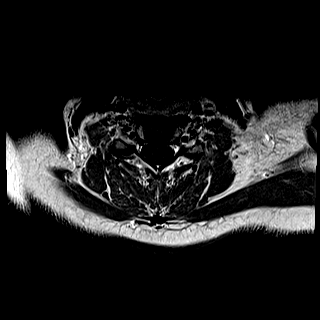
[im 16/28]
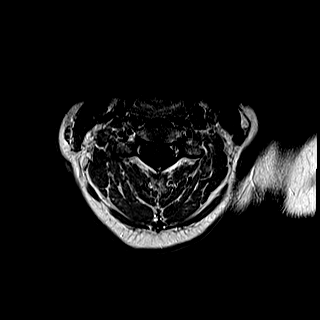
[im 20/28]
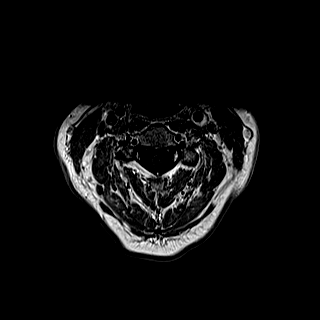
[im 24/28]
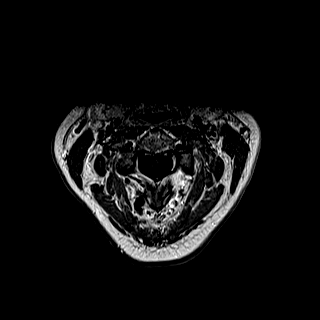
[im 28/28]
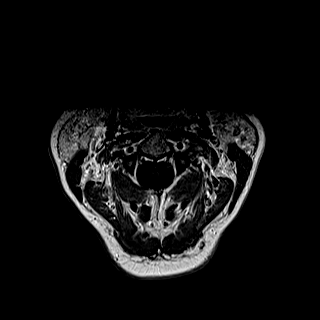

[41 of 48 positions shown; findings below may reference images not displayed]

FINDINGS: Alignment: Anatomic.

Vertebrae: No worrisome osseous lesion. No abnormal postcontrast
enhancement.

Cord: Normal signal and morphology. No atrophy, enlargement,
intrinsic T2 hyperintensity, or postcontrast enhancement.

Posterior Fossa, vertebral arteries, paraspinal tissues:
Unremarkable.

Disc levels:

No disc protrusion or spinal stenosis. No foraminal narrowing of
significance.
IMPRESSION: Negative exam.

## 2019-05-17 ENCOUNTER — Ambulatory Visit (INDEPENDENT_AMBULATORY_CARE_PROVIDER_SITE_OTHER): Payer: 59 | Admitting: Podiatry

## 2019-05-17 ENCOUNTER — Encounter: Payer: Self-pay | Admitting: Podiatry

## 2019-05-17 ENCOUNTER — Other Ambulatory Visit: Payer: Self-pay

## 2019-05-17 ENCOUNTER — Ambulatory Visit (INDEPENDENT_AMBULATORY_CARE_PROVIDER_SITE_OTHER): Payer: 59

## 2019-05-17 ENCOUNTER — Other Ambulatory Visit: Payer: Self-pay | Admitting: Podiatry

## 2019-05-17 DIAGNOSIS — M2012 Hallux valgus (acquired), left foot: Secondary | ICD-10-CM

## 2019-05-17 DIAGNOSIS — Z09 Encounter for follow-up examination after completed treatment for conditions other than malignant neoplasm: Secondary | ICD-10-CM | POA: Diagnosis not present

## 2019-05-17 DIAGNOSIS — D169 Benign neoplasm of bone and articular cartilage, unspecified: Secondary | ICD-10-CM

## 2019-05-17 NOTE — Progress Notes (Signed)
Subjective:   Patient ID: Brittney Alvarez, female   DOB: 48 y.o.   MRN: [id number]   HPI Patient presents stating I seem to be doing better not getting the same kind of pain but I still note a little bit of burning on the end of my big toe   ROS      Objective:  Physical Exam  Neurovascular status intact negative Homans sign noted with patient's left first metatarsal healing very well along with the side of the left big toe with good alignment noted and fixation in place     Assessment:  Overall doing well with good alignment noted and good structural correction with no indication of nerve entrapment     Plan:  Reviewed condition and recommended the continuation of routine care and gradual return to soft shoe and dispensed ankle compression stocking along with range of motion exercises  X-rays indicate that the osteotomy is healing well fixation in place joint congruence

## 2019-05-25 ENCOUNTER — Ambulatory Visit (INDEPENDENT_AMBULATORY_CARE_PROVIDER_SITE_OTHER): Payer: 59 | Admitting: Podiatry

## 2019-05-25 ENCOUNTER — Telehealth: Payer: Self-pay | Admitting: *Deleted

## 2019-05-25 ENCOUNTER — Other Ambulatory Visit: Payer: Self-pay

## 2019-05-25 ENCOUNTER — Encounter: Payer: Self-pay | Admitting: Podiatry

## 2019-05-25 DIAGNOSIS — M2012 Hallux valgus (acquired), left foot: Secondary | ICD-10-CM

## 2019-05-25 DIAGNOSIS — Z09 Encounter for follow-up examination after completed treatment for conditions other than malignant neoplasm: Secondary | ICD-10-CM

## 2019-05-25 NOTE — Telephone Encounter (Signed)
Pt states the stitches that were to dissolve, have not and are irritating.

## 2019-05-25 NOTE — Telephone Encounter (Signed)
I called pt and informed the sutures that remain moist beneath the skin dissolve and the knotted ends are dry may not. I asked I she had redness around the areas and pt states she does. I offered pt an appt today with the clinical nurse at 3:30pm and she accepted.

## 2019-05-26 ENCOUNTER — Encounter: Payer: Self-pay | Admitting: Podiatry

## 2019-05-26 NOTE — Progress Notes (Signed)
  Subjective:  Patient ID: Brittney Alvarez, female    DOB: Feb 10, 1971,  MRN: [id number]  Chief Complaint  Patient presents with  . Routine Post Op    " my foot feels ok, but these stiches are really bothering me and the redness has not improved"     DOS: 04/2019 Procedure: Left Liane Comber Bunionectomy  48 y.o. female returns for post-op check. She is going well. She is concerned for little redness around the incision site by the sutures.  Review of Systems: Negative except as noted in the HPI. Denies N/V/F/Ch.  History reviewed. No pertinent past medical history.  Current Outpatient Medications:  .  albuterol (PROAIR HFA) 108 (90 Base) MCG/ACT inhaler, , Disp: , Rfl:  .  amphetamine-dextroamphetamine (ADDERALL) 10 MG tablet, Take by mouth., Disp: , Rfl:  .  lamoTRIgine (LAMICTAL) 100 MG tablet, Take 100 mg by mouth 2 (two) times daily., Disp: , Rfl:  .  traZODone (DESYREL) 50 MG tablet, , Disp: , Rfl:   Current Facility-Administered Medications:  .  triamcinolone acetonide (KENALOG) 10 MG/ML injection 10 mg, 10 mg, Other, Once, Regal, Tamala Fothergill, DPM  Social History   Tobacco Use  Smoking Status Unknown If Ever Smoked  Smokeless Tobacco Never Used    No Known Allergies Objective:  There were no vitals filed for this visit. There is no height or weight on file to calculate BMI. Constitutional Well developed. Well nourished.  Vascular Foot warm and well perfused. Capillary refill normal to all digits.   Neurologic Normal speech. Oriented to person, place, and time. Epicritic sensation to light touch grossly present bilaterally.  Dermatologic Skin healing well without signs of infection. Skin edges well coapted without signs of infection.  Orthopedic: Tenderness to palpation noted about the surgical site.   Radiographs: Xray reviewed. The hardware is intact.  No signs of loosening or backing out noted.  Good healing noted across osteotomy site with consolidation of the bone..  Assessment:   1. Hallux valgus of left foot   2. Surgery follow-up    Plan:  Patient was evaluated and treated and all questions answered.  S/p foot surgery left possible skin irritation from deep suture. -Progressing as expected post-operatively. -XR: See above -WB Status: WBAT in CAM boot -Sutures: Suture tails were cut. -Medications: None -Foot redressed.  No follow-ups on file.

## 2019-06-16 ENCOUNTER — Other Ambulatory Visit: Payer: 59

## 2019-06-17 ENCOUNTER — Encounter: Payer: 59 | Admitting: Podiatry

## 2019-07-12 ENCOUNTER — Ambulatory Visit (INDEPENDENT_AMBULATORY_CARE_PROVIDER_SITE_OTHER): Payer: 59

## 2019-07-12 ENCOUNTER — Other Ambulatory Visit: Payer: Self-pay

## 2019-07-12 ENCOUNTER — Ambulatory Visit (INDEPENDENT_AMBULATORY_CARE_PROVIDER_SITE_OTHER): Payer: Self-pay | Admitting: Podiatry

## 2019-07-12 ENCOUNTER — Encounter: Payer: Self-pay | Admitting: Podiatry

## 2019-07-12 DIAGNOSIS — M2012 Hallux valgus (acquired), left foot: Secondary | ICD-10-CM | POA: Diagnosis not present

## 2019-07-12 NOTE — Progress Notes (Signed)
Subjective:   Patient ID: Brittney Alvarez, female   DOB: 48 y.o.   MRN: [id number]   HPI Patient states overall doing really well but having occasional pain if I am on it too long   ROS      Objective:  Physical Exam  Neurovascular status intact with patient's left foot healing well wound edges well coapted good range of motion no crepitus of the joint noted with no significant elevation but could use some increase strength of the extensor hallucis longus tendon     Assessment:  Overall doing well post foot surgery left     Plan:  Advised on exercises to strengthen the extensor tendon and advised to continue range of motion gradual return to all normal shoe gear and will be seen back as needed  X-rays indicate the osteotomy is healed well fixation in place joint congruence with good correction of deformity

## 2019-07-22 ENCOUNTER — Encounter: Payer: 59 | Admitting: Podiatry

## 2019-08-09 ENCOUNTER — Ambulatory Visit: Payer: 59 | Attending: Internal Medicine

## 2019-08-09 DIAGNOSIS — Z20822 Contact with and (suspected) exposure to covid-19: Secondary | ICD-10-CM

## 2019-08-10 LAB — NOVEL CORONAVIRUS, NAA: SARS-CoV-2, NAA: NOT DETECTED

## 2019-08-30 ENCOUNTER — Other Ambulatory Visit: Payer: Self-pay

## 2019-08-30 ENCOUNTER — Ambulatory Visit (INDEPENDENT_AMBULATORY_CARE_PROVIDER_SITE_OTHER): Payer: 59 | Admitting: Family Medicine

## 2019-08-30 ENCOUNTER — Ambulatory Visit: Payer: 59 | Admitting: Family Medicine

## 2019-08-30 ENCOUNTER — Ambulatory Visit: Payer: Self-pay

## 2019-08-30 ENCOUNTER — Encounter: Payer: Self-pay | Admitting: Family Medicine

## 2019-08-30 DIAGNOSIS — M545 Low back pain, unspecified: Secondary | ICD-10-CM

## 2019-08-30 MED ORDER — NABUMETONE 750 MG PO TABS: 750.0000 mg | ORAL_TABLET | Freq: Two times a day (BID) | ORAL | 6 refills | Status: DC | PRN

## 2019-08-30 MED ORDER — METHYLPREDNISOLONE 4 MG PO TBPK: ORAL_TABLET | ORAL | 0 refills | Status: DC

## 2019-08-30 MED ORDER — TIZANIDINE HCL 2 MG PO TABS: 2.0000 mg | ORAL_TABLET | Freq: Four times a day (QID) | ORAL | 1 refills | Status: DC | PRN

## 2019-08-30 NOTE — Progress Notes (Signed)
Office Visit Note   Patient: Brittney Alvarez           Date of Birth: Sep 25, 1970           MRN: [id number] Visit Date: 08/30/2019 Requested by: Angelina Sheriff, MD [place],  [place] [id number] PCP: Angelina Sheriff, MD  Subjective: Chief Complaint  Patient presents with  . Lower Back - Pain    Pain across lower back and left lateral hip x since 08/20/19. Started just on the right side, but then moved across the back. NKI, but had been cleaning a tub. Tried massage, advil/tylenol and heating pad.    HPI: She is here with left-sided low back pain.  Symptoms started January 1, no definite injury.  Shortly before her pain started, she was cleaning out a bathtub but she did not notice any pain while cleaning.  Since then, it hurts in most positions except lying flat on her back.  Pain starts to radiate into the posterior left hip but does not go down the leg.  Denies any numbness or tingling or weakness, denies any bowel or bladder dysfunction.  No history of kidney stones.  No history of major back problems.  She does have a family history of back problems in her mother requiring surgery.  Patient went to a massage therapist with temporary relief.  She has tried ibuprofen and Tylenol as well as a TENS unit.               ROS:   All other systems were reviewed and are negative.  Objective: Vital Signs: There were no vitals taken for this visit.  Physical Exam:  General:  Alert and oriented, in no acute distress. Pulm:  Breathing unlabored. Psy:  Normal mood, congruent affect. Skin: No visible rash. Low back: Slight tenderness near around L3-4, L4-5 levels mainly in the paraspinous muscles.  Slight tenderness in the left gluteus medius region.  No pain in the sciatic notch, no pain with Patrick's test or piriformis stretch.  Straight leg raise is negative, lower extremity strength is normal.  Reflexes are 2+ and symmetric.  Imaging: X-rays lumbar spine: She has a very mild  lumbar scoliosis.  He has mild to moderate L1-2 degenerative disc disease, and moderate L5-S1 facet DJD.  No sign of compression fracture or neoplasm.    Assessment & Plan: 1.  Left-sided low back pain, possibly muscular strain but cannot rule out upper lumbar disc protrusion. -Physical therapy, anti-inflammatories and muscle relaxant as needed.  Medrol Dosepak if symptoms worsen.  MRI scan if she fails conservative management.     Procedures: No procedures performed  No notes on file     PMFS History: Patient Active Problem List   Diagnosis Date Noted  . ADD (attention deficit disorder) 04/09/2019  . Menorrhagia with regular cycle 04/09/2019  . Lesion of radial nerve 12/15/2018  . Occipital neuralgia of left side 01/30/2018  . Right lateral epicondylitis 11/19/2017   History reviewed. No pertinent past medical history.  History reviewed. No pertinent family history.  History reviewed. No pertinent surgical history. Social History   Occupational History  . Not on file  Tobacco Use  . Smoking status: Unknown If Ever Smoked  . Smokeless tobacco: Never Used  Substance and Sexual Activity  . Alcohol use: Not on file  . Drug use: Not on file  . Sexual activity: Not on file

## 2019-09-10 ENCOUNTER — Other Ambulatory Visit: Payer: 59

## 2019-09-10 ENCOUNTER — Ambulatory Visit: Payer: 59 | Attending: Internal Medicine

## 2019-09-10 DIAGNOSIS — Z20822 Contact with and (suspected) exposure to covid-19: Secondary | ICD-10-CM

## 2019-09-11 LAB — NOVEL CORONAVIRUS, NAA: SARS-CoV-2, NAA: NOT DETECTED

## 2020-02-09 ENCOUNTER — Other Ambulatory Visit: Payer: Self-pay

## 2020-02-09 ENCOUNTER — Ambulatory Visit: Payer: Self-pay

## 2020-02-09 ENCOUNTER — Ambulatory Visit (INDEPENDENT_AMBULATORY_CARE_PROVIDER_SITE_OTHER): Payer: 59

## 2020-02-09 ENCOUNTER — Ambulatory Visit (INDEPENDENT_AMBULATORY_CARE_PROVIDER_SITE_OTHER): Payer: 59 | Admitting: Family Medicine

## 2020-02-09 ENCOUNTER — Encounter: Payer: Self-pay | Admitting: Family Medicine

## 2020-02-09 DIAGNOSIS — M79645 Pain in left finger(s): Secondary | ICD-10-CM | POA: Diagnosis not present

## 2020-02-09 NOTE — Progress Notes (Signed)
° °  Office Visit Note   Patient: Brittney Alvarez           Date of Birth: [birth year]           MRN: [id number] Visit Date: 02/09/2020 Requested by: Angelina Sheriff, MD [place],   [id number] PCP: Angelina Sheriff, MD  Subjective: Chief Complaint  Patient presents with   Left Thumb - Pain    Pain x a couple days. NKI. No swelling. Tender to touch in a certain spot - not so much with movement.    HPI: She is here with left thumb pain.  Symptoms started about 2 or 3 days ago, no injury.  Pain on the palm side of her thumb near the MCP joint.  Hurts to stretch her thumb in certain positions.  No change in her activities to account for the pain.  Occasional numbness in her fingers but nothing consistent.  She is right-hand dominant.              ROS:   All other systems were reviewed and are negative.  Objective: Vital Signs: There were no vitals taken for this visit.  Physical Exam:  General:  Alert and oriented, in no acute distress. Pulm:  Breathing unlabored. Psy:  Normal mood, congruent affect. Skin: No rash or erythema Right thumb: Full flexion extension, good strength against resistance in all directions.  She has tenderness near the A1 pulley and possibly a tiny ganglion cyst.  Positive Tinel's carpal tunnel, positive Phalen's test.  No thenar atrophy.  Imaging: US Guided Needle Placement  Result Date: 02/09/2020 Limited diagnostic ultrasound reveals slight thickening of the A1 pulley.  No other significant abnormality.  XR Finger Thumb Left  Result Date: 02/09/2020 X-rays right thumb reveal very early joint space narrowing at the MCP joint, no acute abnormality.   Assessment & Plan: 1.  Left thumb pain, possible trigger finger.  I believe she has carpal tunnel syndrome as well but I doubt this is the cause of her current pain. -Removable thumb spica splint, she will wear it during the day until her thumb quits hurting and at night for the next 4 to 6  weeks until her carpal tunnel symptoms subside. -She has pennsaid at home and she will use that topically. -Consider injection if symptoms worsen.     Procedures: No procedures performed  No notes on file     PMFS History: Patient Active Problem List   Diagnosis Date Noted   ADD (attention deficit disorder) 04/09/2019   Menorrhagia with regular cycle 04/09/2019   Lesion of radial nerve 12/15/2018   Occipital neuralgia of left side 01/30/2018   Right lateral epicondylitis 11/19/2017   History reviewed. No pertinent past medical history.  History reviewed. No pertinent family history.  History reviewed. No pertinent surgical history. Social History   Occupational History   Not on file  Tobacco Use   Smoking status: Unknown If Ever Smoked   Smokeless tobacco: Never Used  Substance and Sexual Activity   Alcohol use: Not on file   Drug use: Not on file   Sexual activity: Not on file

## 2020-08-27 ENCOUNTER — Ambulatory Visit
Admission: EM | Admit: 2020-08-27 | Discharge: 2020-08-27 | Disposition: A | Discharge status: Home / Self Care | Payer: 59 | Attending: Physician Assistant | Admitting: Physician Assistant

## 2020-08-27 ENCOUNTER — Encounter: Payer: Self-pay | Admitting: Emergency Medicine

## 2020-08-27 ENCOUNTER — Other Ambulatory Visit: Payer: Self-pay

## 2020-08-27 DIAGNOSIS — J029 Acute pharyngitis, unspecified: Secondary | ICD-10-CM

## 2020-08-27 DIAGNOSIS — Z20822 Contact with and (suspected) exposure to covid-19: Secondary | ICD-10-CM

## 2020-08-27 LAB — GROUP A STREP BY PCR: Group A Strep by PCR: NOT DETECTED

## 2020-08-27 NOTE — ED Triage Notes (Signed)
Patient c/o cough and sore throat that started today.  Patient denies fevers.  Patient states that her husband has COVID.  Patient states that she was tested yesterday and was negative for COVID.

## 2020-08-27 NOTE — ED Provider Notes (Signed)
[facility]    CSN: [id number] Arrival date & time: 08/27/20  1235      History   Chief Complaint Chief Complaint  Patient presents with  . Sore Throat    HPI Brittney Alvarez is a 50 y.o. female presenting for sore throat starting today. She denies other symptoms.  Patient says that her husband has COVID-19 currently.  She says that she took a Covid test at home yesterday which was negative.  Patient was not sick at the time she took her test.  She denies any fever, fatigue, body aches, cough, congestion, chest pain, breathing problem, nausea/vomiting or diarrhea.  Patient has been flu vaccine for COVID-19.  Denies any history of cardiopulmonary disease.  Has not been taking any over-the-counter medicine for symptoms.  No other concerns.  HPI  History reviewed. No pertinent past medical history.  Patient Active Problem List   Diagnosis Date Noted  . ADD (attention deficit disorder) 04/09/2019  . Menorrhagia with regular cycle 04/09/2019  . Lesion of radial nerve 12/15/2018  . Occipital neuralgia of left side 01/30/2018  . Right lateral epicondylitis 11/19/2017    History reviewed. No pertinent surgical history.  OB History   No obstetric history on file.      Home Medications    Prior to Admission medications   Medication Sig Start Date End Date Taking? Authorizing Provider  traZODone (DESYREL) 50 MG tablet  03/09/19  Yes [provider]  albuterol (PROAIR HFA) 108 (90 Base) MCG/ACT inhaler  11/20/17   [provider]  amphetamine-dextroamphetamine (ADDERALL) 10 MG tablet Take by mouth.    [provider]  diazepam (VALIUM) 5 MG tablet Take 5 mg by mouth every 6 (six) hours as needed for anxiety.    [provider]  lamoTRIgine (LAMICTAL) 100 MG tablet Take 100 mg by mouth 2 (two) times daily.    [provider]  methylPREDNISolone (MEDROL DOSEPAK) 4 MG TBPK tablet As directed for 6 days. 08/30/19   Hilts, Legrand Como, MD   nabumetone (RELAFEN) 750 MG tablet Take 1 tablet (750 mg total) by mouth 2 (two) times daily as needed. 08/30/19   Hilts, Legrand Como, MD  tiZANidine (ZANAFLEX) 2 MG tablet Take 1-2 tablets (2-4 mg total) by mouth every 6 (six) hours as needed for muscle spasms. 08/30/19   Hilts, Legrand Como, MD    Family History History reviewed. No pertinent family history.  Social History Social History   Tobacco Use  . Smoking status: Never Smoker  . Smokeless tobacco: Never Used  Vaping Use  . Vaping Use: Never used  Substance Use Topics  . Alcohol use: Yes  . Drug use: Never     Allergies   Other   Review of Systems Review of Systems  Constitutional: Negative for chills, diaphoresis, fatigue and fever.  HENT: Positive for sore throat. Negative for congestion, ear pain, rhinorrhea, sinus pressure and sinus pain.   Respiratory: Negative for cough and shortness of breath.   Gastrointestinal: Negative for abdominal pain, nausea and vomiting.  Musculoskeletal: Negative for arthralgias and myalgias.  Skin: Negative for rash.  Neurological: Negative for weakness and headaches.  Hematological: Negative for adenopathy.     Physical Exam Triage Vital Signs ED Triage Vitals  Enc Vitals Group     BP 08/27/20 1333 101/63     Pulse Rate 08/27/20 1333 78     Resp 08/27/20 1333 14     Temp 08/27/20 1333 98.4 F (36.9 C)     Temp  Source 08/27/20 1333 Oral     SpO2 08/27/20 1333 100 %     Weight 08/27/20 1330 165 lb (74.8 kg)     Height 08/27/20 1330 5\' 10"  (1.778 m)     Head Circumference --      Peak Flow --      Pain Score 08/27/20 1329 4     Pain Loc --      Pain Edu? --      Excl. in [place]? --    No data found.  Updated Vital Signs BP 101/63 (BP Location: Left Arm)   Pulse 78   Temp 98.4 F (36.9 C) (Oral)   Resp 14   Ht 5\' 10"  (1.778 m)   Wt 165 lb (74.8 kg)   LMP 08/06/2020 (Approximate)   SpO2 100%   BMI 23.68 kg/m        Physical Exam Vitals and nursing note reviewed.   Constitutional:      General: She is not in acute distress.    Appearance: Normal appearance. She is not ill-appearing or toxic-appearing.  HENT:     Head: Normocephalic and atraumatic.     Nose: Nose normal.     Mouth/Throat:     Mouth: Mucous membranes are moist.     Pharynx: Oropharynx is clear. Posterior oropharyngeal erythema present.  Eyes:     General: No scleral icterus.       Right eye: No discharge.        Left eye: No discharge.     Conjunctiva/sclera: Conjunctivae normal.  Cardiovascular:     Rate and Rhythm: Normal rate and regular rhythm.     Heart sounds: Normal heart sounds.  Pulmonary:     Effort: Pulmonary effort is normal. No respiratory distress.     Breath sounds: Normal breath sounds.  Musculoskeletal:     Cervical back: Neck supple.  Skin:    General: Skin is dry.  Neurological:     General: No focal deficit present.     Mental Status: She is alert. Mental status is at baseline.     Motor: No weakness.     Gait: Gait normal.  Psychiatric:        Mood and Affect: Mood normal.        Behavior: Behavior normal.        Thought Content: Thought content normal.      UC Treatments / Results  Labs (all labs ordered are listed, but only abnormal results are displayed) Labs Reviewed  GROUP A STREP BY PCR  SARS CORONAVIRUS 2 (TAT 6-24 HRS)    EKG   Radiology No results found.  Procedures Procedures (including critical care time)  Medications Ordered in UC Medications - No data to display  Initial Impression / Assessment and Plan / UC Course  I have reviewed the triage vital signs and the nursing notes.  Pertinent labs & imaging results that were available during my care of the patient were reviewed by me and considered in my medical decision making (see chart for details).   Molecular strep test negative.  Suspect viral illness and most likely Covid given her exposure.  Current CDC guidelines, isolation protocol and ED precautions reviewed  with patient.  Offered Viscous Lidocaine and nasal spray but she declined.  Advised supportive care with over-the-counter medications.  Advised to follow-up with our clinic as needed.   Final Clinical Impressions(s) / UC Diagnoses   Final diagnoses:  Pharyngitis, unspecified etiology  Exposure to COVID-19 virus  Discharge Instructions     You have received COVID testing today either for positive exposure, concerning symptoms that could be related to COVID infection, screening purposes, or re-testing after confirmed positive.  Your test obtained today checks for active viral infection in the last 1-2 weeks. If your test is negative now, you can still test positive later. So, if you do develop symptoms you should either get re-tested and/or isolate x 5 days and then strict mask use x 5 days (unvaccinated) or mask use x 10 days (vaccinated). Please follow CDC guidelines.  While Rapid antigen tests come back in 15-20 minutes, send out PCR/molecular test results typically come back within 1-3 days. In the mean time, if you are symptomatic, assume this could be a positive test and treat/monitor yourself as if you do have COVID.   We will call with test results if positive. Please download the MyChart app and set up a profile to access test results.   If symptomatic, go home and rest. Push fluids. Take Tylenol as needed for discomfort. Gargle warm salt water. Throat lozenges. Take Mucinex DM or Robitussin for cough. Humidifier in bedroom to ease coughing. Warm showers. Also review the COVID handout for more information.  COVID-19 INFECTION: The incubation period of COVID-19 is approximately 14 days after exposure, with most symptoms developing in roughly 4-5 days. Symptoms may range in severity from mild to critically severe. Roughly 80% of those infected will have mild symptoms. People of any age may become infected with COVID-19 and have the ability to transmit the virus. The most common symptoms  include: fever, fatigue, cough, body aches, headaches, sore throat, nasal congestion, shortness of breath, nausea, vomiting, diarrhea, changes in smell and/or taste.    COURSE OF ILLNESS Some patients may begin with mild disease which can progress quickly into critical symptoms. If your symptoms are worsening please call ahead to the Emergency Department and proceed there for further treatment. Recovery time appears to be roughly 1-2 weeks for mild symptoms and 3-6 weeks for severe disease.   GO IMMEDIATELY TO ER FOR FEVER YOU ARE UNABLE TO GET DOWN WITH TYLENOL, BREATHING PROBLEMS, CHEST PAIN, FATIGUE, LETHARGY, INABILITY TO EAT OR DRINK, ETC  QUARANTINE AND ISOLATION: To help decrease the spread of COVID-19 please remain isolated if you have COVID infection or are highly suspected to have COVID infection. This means -stay home and isolate to one room in the home if you live with others. Do not share a bed or bathroom with others while ill, sanitize and wipe down all countertops and keep common areas clean and disinfected. Stay home for 5 days. If you have no symptoms or your symptoms are resolving after 5 days, you can leave your house. Continue to wear a mask around others for 5 additional days. If you have been in close contact (within 6 feet) of someone diagnosed with COVID 19, you are advised to quarantine in your home for 14 days as symptoms can develop anywhere from 2-14 days after exposure to the virus. If you develop symptoms, you  must isolate.  Most current guidelines for COVID after exposure -unvaccinated: isolate 5 days and strict mask use x 5 days. Test on day 5 is possible -vaccinated: wear mask x 10 days if symptoms do not develop -You do not necessarily need to be tested for COVID if you have + exposure and  develop symptoms. Just isolate at home x10 days from symptom onset During this global pandemic, CDC advises to practice social  distancing, try to stay at least 67ft away from others  at all times. Wear a face covering. Wash and sanitize your hands regularly and avoid going anywhere that is not necessary.  KEEP IN MIND THAT THE COVID TEST IS NOT 100% ACCURATE AND YOU SHOULD STILL DO EVERYTHING TO PREVENT POTENTIAL SPREAD OF VIRUS TO OTHERS (WEAR MASK, WEAR GLOVES, [place] HANDS AND SANITIZE REGULARLY). IF INITIAL TEST IS NEGATIVE, THIS MAY NOT MEAN YOU ARE DEFINITELY NEGATIVE. MOST ACCURATE TESTING IS DONE 5-7 DAYS AFTER EXPOSURE.   It is not advised by CDC to get re-tested after receiving a positive COVID test since you can still test positive for weeks to months after you have already cleared the virus.   *If you have not been vaccinated for COVID, I strongly suggest you consider getting vaccinated as long as there are no contraindications.      ED Prescriptions    None     PDMP not reviewed this encounter.   Danton Clap, PA-C 08/27/20 1552

## 2020-08-27 NOTE — Discharge Instructions (Addendum)

## 2020-08-28 LAB — SARS CORONAVIRUS 2 (TAT 6-24 HRS): SARS Coronavirus 2: NEGATIVE

## 2020-10-04 ENCOUNTER — Other Ambulatory Visit: Payer: Self-pay

## 2020-10-04 ENCOUNTER — Ambulatory Visit (INDEPENDENT_AMBULATORY_CARE_PROVIDER_SITE_OTHER): Payer: 59 | Admitting: Family Medicine

## 2020-10-04 ENCOUNTER — Encounter: Payer: Self-pay | Admitting: Family Medicine

## 2020-10-04 ENCOUNTER — Ambulatory Visit (INDEPENDENT_AMBULATORY_CARE_PROVIDER_SITE_OTHER): Payer: 59

## 2020-10-04 DIAGNOSIS — M25562 Pain in left knee: Secondary | ICD-10-CM

## 2020-10-04 NOTE — Progress Notes (Signed)
I saw and examined the patient with Dr. Elouise Munroe and agree with assessment and plan as outlined.    Left knee contusion one week ago.  Tender over patella.  Wound healing well, no sign of infection.  X-Rays unremarkable.  Did not want meds.  Anticipate bone bruise will heal in a week or two.  Return as needed.

## 2020-10-04 NOTE — Progress Notes (Signed)
Office Visit Note   Patient: Brittney Alvarez           Date of Birth: [birth year]           MRN: [id number] Visit Date: 10/04/2020 Requested by: Angelina Sheriff, MD [place],  [place] [id number] PCP: Angelina Sheriff, MD  Subjective: Chief Complaint  Patient presents with  . Left Knee - Pain    Hit the left knee again a sharp drawer pull last week while getting up from sitting at her desk. She hit it so hard that it ripped her pants and cut her knee (right over the patella). Pain superior and inferior to the patella with straightening her leg. Mentioned that for a couple months, she has had pain in the anterolateral area of that knee.    HPI: 50yo F presenting to clinic with concerns of acute left knee pain after a direct blow to the patella one week ago. Patient states she was rising from her desk, and struck her knee against the drawer-pull, which had a very sharp edge. She hit hard enough to slice open her blue jeans and cut into her skin. Denies being able to see bone beneath the cut at the time. States that she feels as though the skin is healing well, but continues to endorse significant peripatellar pain, stating it is extremely tender to palpation, or painful when she sits for 'any more than 5 minutes.' No swelling in the knee. No fevers, no chills. No weakness.               ROS:   All other systems were reviewed and are negative.  Objective: Vital Signs: There were no vitals taken for this visit.  Physical Exam:  General:  Alert and oriented, in no acute distress. Pulm:  Breathing unlabored. Psy:  Normal mood, congruent affect. Skin:  Small (1.5cm) linear laceration in central patellar area. This appears to be healing well, without surrounding erythema or purulent drainage.   LEFT KNEE EXAM:  General: Normal gait Standing exam: No varus or valgus deformity of the knee.   Seated Exam:  No crepitus, Negative J-Sign.   Palpation: Significant tenderness with  palpation of patella. No obvious bony deformities appreciated. Tenderness at medial patellar facet as well.  No tenderness to palpation over medial or lateral joint lines. Some tenderness at lateral femoral condyle, though states this predates injury.   Supine exam: No effusion, normal patellar mobility.   Ligamentous Exam:  No pain or laxity with anterior/posterior drawer.  No obvious Sag.  No pain or laxity with varus/valgus stress across the knee.   Meniscus:  McMurray with no pain or deep clicking.  Thessaly negative.   Strength: Hip flexion (L1), Hip Aduction (L2), Knee Extension (L3) are 5/5 Bilaterally Foot Inversion (L4), Dorsiflexion (L5), and Eversion (S1) 5/5 Bilaterally  Sensation: Intact to light touch medial and lateral aspects of lower extremities, and lateral, dorsal, and medial aspects of foot.    Imaging: XR KNEE 3 VIEW LEFT  Result Date: 10/04/2020 X-Rays show normal bony anatomy and alignment, no acute fracture, no degenerative changes.   Assessment & Plan: 50yo F presenting to clinic with acute left knee pain after a direct patellar blow. No fracture on Xray, and suspect she has flared up subpatellar inflammation causing her persistent symptoms.  - Reassurance given that this should heal well with time, though could consider quad rehabilitation if her pain persists.  - Patient declines need for  NSAIDs - RTC if not improving in 2-3 weeks. - Patient expresses understanding with plan, and has no further questions or concerns today.      Procedures: No procedures performed        PMFS History: Patient Active Problem List   Diagnosis Date Noted  . ADD (attention deficit disorder) 04/09/2019  . Menorrhagia with regular cycle 04/09/2019  . Lesion of radial nerve 12/15/2018  . Occipital neuralgia of left side 01/30/2018  . Right lateral epicondylitis 11/19/2017   History reviewed. No pertinent past medical history.  History reviewed. No pertinent family  history.  History reviewed. No pertinent surgical history. Social History   Occupational History  . Not on file  Tobacco Use  . Smoking status: Never Smoker  . Smokeless tobacco: Never Used  Vaping Use  . Vaping Use: Never used  Substance and Sexual Activity  . Alcohol use: Yes  . Drug use: Never  . Sexual activity: Not on file

## 2020-11-14 ENCOUNTER — Other Ambulatory Visit: Payer: Self-pay | Admitting: Obstetrics and Gynecology

## 2020-11-14 DIAGNOSIS — Z1231 Encounter for screening mammogram for malignant neoplasm of breast: Secondary | ICD-10-CM

## 2020-11-16 ENCOUNTER — Other Ambulatory Visit: Payer: Self-pay

## 2020-11-16 ENCOUNTER — Ambulatory Visit
Admission: RE | Admit: 2020-11-16 | Discharge: 2020-11-16 | Disposition: A | Discharge status: Home / Self Care | Payer: 59 | Source: Ambulatory Visit

## 2020-11-16 DIAGNOSIS — Z1231 Encounter for screening mammogram for malignant neoplasm of breast: Secondary | ICD-10-CM

## 2021-03-08 ENCOUNTER — Ambulatory Visit (INDEPENDENT_AMBULATORY_CARE_PROVIDER_SITE_OTHER): Payer: 59 | Admitting: Orthopedic Surgery

## 2021-03-08 ENCOUNTER — Encounter: Payer: Self-pay | Admitting: Orthopedic Surgery

## 2021-03-08 ENCOUNTER — Other Ambulatory Visit: Payer: Self-pay

## 2021-03-08 ENCOUNTER — Ambulatory Visit (INDEPENDENT_AMBULATORY_CARE_PROVIDER_SITE_OTHER): Payer: 59

## 2021-03-08 VITALS — Ht 70.0 in | Wt 175.0 lb

## 2021-03-08 DIAGNOSIS — M25562 Pain in left knee: Secondary | ICD-10-CM

## 2021-03-08 NOTE — Progress Notes (Addendum)
Office Visit Note   Patient: Brittney Alvarez           Date of Birth: 1970/09/22           MRN: [id number] Visit Date: 03/08/2021 Requested by: Angelina Sheriff, MD [place],  [place] [id number] PCP: Angelina Sheriff, MD  Subjective: Chief Complaint  Patient presents with   Left Knee - Pain    HPI: Brittney Alvarez is a 50 year old patient with left knee pain for several months.  Hurts primarily on the medial aspect of the left knee.  Denies any discrete history of injury.  Does give her a little bit of pain when she extends it after sitting in a crosslegged position.  Takes Aleve and Tylenol with slight relief.  Denies any history of mechanical symptoms.  Pain is been present for 3 months.  She is able to do stairs okay.  She walks some for exercise and pain does not limit her walking endurance.  Her symptoms are getting worse particularly going from the sitting to standing position.              ROS: All systems reviewed are negative as they relate to the chief complaint within the history of present illness.  Patient denies  fevers or chills.   Assessment & Plan: Visit Diagnoses:  1. Acute pain of left knee     Plan: Impression is left knee pain with possible medial meniscal pathology but no effusion and no mechanical symptoms.  Injection performed today and we will see how she does in 6 weeks with decision for or against MRI scan at that time.  Continue with nonweightbearing quad strengthening exercises.  Follow-Up Instructions: Return in about 6 weeks (around 04/19/2021).   Orders:  Orders Placed This Encounter  Procedures   XR KNEE 3 VIEW LEFT   No orders of the defined types were placed in this encounter.     Procedures: No procedures performed   Clinical Data: No additional findings.  Objective: Vital Signs: Ht 5\' 10"  (1.778 m)   Wt 175 lb (79.4 kg)   BMI 25.11 kg/m   Physical Exam:   Constitutional: Patient appears well-developed HEENT:  Head:  Normocephalic Eyes:EOM are normal Neck: Normal range of motion Cardiovascular: Normal rate Pulmonary/chest: Effort normal Neurologic: Patient is alert Skin: Skin is warm Psychiatric: Patient has normal mood and affect   Ortho Exam: Ortho exam demonstrates full active and passive range of motion of the left knee.  No groin pain with internal ex rotation of the leg.  Pedal pulses palpable.  Ankle dorsiflexion intact.  Knee range of motion is full with minimal crepitus bilaterally.  No patellar apprehension.  Has posterior medial joint line tenderness on the left on the right.  Equivocal McMurray compression testing.  Collateral crucial ligaments are stable.  No other masses lymphadenopathy or skin changes noted in that left knee region  Specialty Comments:  No specialty comments available.  Imaging: XR KNEE 3 VIEW LEFT  Result Date: 03/08/2021 AP lateral merchant radiographs left knee reviewed.  Alignment intact.  No acute fracture.  Joint space maintained with no spurring or joint space narrowing in any of the 3 compartments.  Normal radiographs left knee    PMFS History: Patient Active Problem List   Diagnosis Date Noted   ADD (attention deficit disorder) 04/09/2019   Menorrhagia with regular cycle 04/09/2019   Lesion of radial nerve 12/15/2018   Occipital neuralgia of left side 01/30/2018  Right lateral epicondylitis 11/19/2017   History reviewed. No pertinent past medical history.  History reviewed. No pertinent family history.  History reviewed. No pertinent surgical history. Social History   Occupational History   Not on file  Tobacco Use   Smoking status: Never   Smokeless tobacco: Never  Vaping Use   Vaping Use: Never used  Substance and Sexual Activity   Alcohol use: Yes   Drug use: Never   Sexual activity: Not on file

## 2021-03-09 LAB — HM PAP SMEAR: HPV 16/18/45 genotyping: NEGATIVE

## 2021-04-02 ENCOUNTER — Ambulatory Visit: Payer: 59 | Admitting: Orthopaedic Surgery

## 2021-04-19 ENCOUNTER — Ambulatory Visit: Payer: 59 | Admitting: Orthopedic Surgery

## 2021-05-07 ENCOUNTER — Ambulatory Visit: Payer: Self-pay

## 2021-05-07 ENCOUNTER — Ambulatory Visit (INDEPENDENT_AMBULATORY_CARE_PROVIDER_SITE_OTHER): Payer: 59 | Admitting: Physician Assistant

## 2021-05-07 ENCOUNTER — Encounter: Payer: Self-pay | Admitting: Physician Assistant

## 2021-05-07 ENCOUNTER — Other Ambulatory Visit: Payer: Self-pay

## 2021-05-07 DIAGNOSIS — M25531 Pain in right wrist: Secondary | ICD-10-CM

## 2021-05-07 NOTE — Progress Notes (Signed)
Office Visit Note   Patient: Brittney Alvarez           Date of Birth: 12-12-1970           MRN: [id number] Visit Date: 05/07/2021              Requested by: Angelina Sheriff, MD [place],  [place] [address] PCP: Angelina Sheriff, MD   Assessment & Plan: Visit Diagnoses:  1. Pain in right wrist     Plan: Place in wrist splint that she is to use during the day she does not need to sleep and at night.  Have her place Voltaren gel over the dorsal aspect of the mid wrist 2 g up to 4 times daily.  Have her follow-up in 2 weeks to see how she is doing overall.  Questions encouraged and answered.  Discussed with her that it is mostly likely represents bony contusion.  Follow-Up Instructions: Return in about 2 weeks (around 05/21/2021).   Orders:  Orders Placed This Encounter  Procedures   XR Wrist Complete Right   No orders of the defined types were placed in this encounter.     Procedures: No procedures performed   Clinical Data: No additional findings.   Subjective: Chief Complaint  Patient presents with   Right Wrist - Pain    HPI Brittney Alvarez is a 50 year old female comes in today with right wrist pain.  [place] and felt backwards on extended right wrist.  She has had ongoing pain in the wrist.  She was cutting some onions yesterday and had severe pain in the the wrist.  She has been taking ibuprofen ice all without any real relief.  She is right-handed.  She had no other injuries. Review of Systems See HPI  Objective: Vital Signs: There were no vitals taken for this visit.  Physical Exam Constitutional:      Appearance: She is not ill-appearing or diaphoretic.  Cardiovascular:     Pulses: Normal pulses.  Neurological:     Mental Status: She is alert and oriented to person, place, and time.  Psychiatric:        Behavior: Behavior normal.    Ortho Exam Right wrist she has good dorsiflexion volar  flexion wrist without pain.  Ulnar and radial deviation causes no discomfort.  She has point tenderness over the wrist dorsally mid wrist.  Remainder the hand is without tenderness or obvious deformity.  There is no rashes skin lesions ulcerations or ecchymosis.  No significant edema.  Radial pulses 2+. Specialty Comments:  No specialty comments available.  Imaging: XR Wrist Complete Right  Result Date: 05/07/2021 Right wrist 3 views: No acute fractures/sclerotic changes.  No subluxations/dislocations right wrist and carpal bone.  No acute findings.    PMFS History: Patient Active Problem List   Diagnosis Date Noted   ADD (attention deficit disorder) 04/09/2019   Menorrhagia with regular cycle 04/09/2019   Lesion of radial nerve 12/15/2018   Occipital neuralgia of left side 01/30/2018   Right lateral epicondylitis 11/19/2017   History reviewed. No pertinent past medical history.  History reviewed. No pertinent family history.  History reviewed. No pertinent surgical history. Social History   Occupational History   Not on file  Tobacco Use   Smoking status: Never   Smokeless tobacco: Never  Vaping Use   Vaping Use: Never used  Substance and Sexual Activity  Alcohol use: Yes   Drug use: Never   Sexual activity: Not on file

## 2021-05-23 ENCOUNTER — Ambulatory Visit: Payer: 59 | Admitting: Orthopaedic Surgery

## 2021-06-03 ENCOUNTER — Ambulatory Visit
Admission: EM | Admit: 2021-06-03 | Discharge: 2021-06-03 | Disposition: A | Discharge status: Home / Self Care | Payer: 59 | Attending: Physician Assistant | Admitting: Physician Assistant

## 2021-06-03 DIAGNOSIS — Z20822 Contact with and (suspected) exposure to covid-19: Secondary | ICD-10-CM

## 2021-06-03 DIAGNOSIS — R051 Acute cough: Secondary | ICD-10-CM

## 2021-06-03 DIAGNOSIS — R0981 Nasal congestion: Secondary | ICD-10-CM | POA: Insufficient documentation

## 2021-06-03 DIAGNOSIS — B349 Viral infection, unspecified: Secondary | ICD-10-CM | POA: Diagnosis not present

## 2021-06-03 LAB — RESP PANEL BY RT-PCR (FLU A&B, COVID) ARPGX2
Influenza A by PCR: NEGATIVE
Influenza B by PCR: NEGATIVE
SARS Coronavirus 2 by RT PCR: NEGATIVE

## 2021-06-03 MED ORDER — PSEUDOEPH-BROMPHEN-DM 30-2-10 MG/5ML PO SYRP: 10.0000 mL | ORAL_SOLUTION | Freq: Four times a day (QID) | ORAL | 0 refills | Status: AC | PRN

## 2021-06-03 NOTE — ED Provider Notes (Signed)
[facility]    CSN: [id number] Arrival date & time: 06/03/21  1151      History   Chief Complaint Chief Complaint  Patient presents with   Cough   Headache    HPI Brittney Alvarez is a 50 y.o. female presenting for 4-day history of sore scratchy throat, nasal congestion, fatigue and cough.  Patient also admits to headache and feeling like she has mucus in her chest.  No fevers.  She was exposed to COVID-19 last week to her friend.  She has had 2 negative home COVID test.  She has been taking Sudafed for her symptoms.  She denies any breathing difficulty, vomiting or diarrhea.  She is otherwise healthy.  No other complaints.  HPI  No past medical history on file.  Patient Active Problem List   Diagnosis Date Noted   ADD (attention deficit disorder) 04/09/2019   Menorrhagia with regular cycle 04/09/2019   Lesion of radial nerve 12/15/2018   Occipital neuralgia of left side 01/30/2018   Right lateral epicondylitis 11/19/2017    No past surgical history on file.  OB History   No obstetric history on file.      Home Medications    Prior to Admission medications   Medication Sig Start Date End Date Taking? Authorizing Provider  albuterol (VENTOLIN HFA) 108 (90 Base) MCG/ACT inhaler  11/20/17  Yes [provider]  amphetamine-dextroamphetamine (ADDERALL) 10 MG tablet Take by mouth.   Yes [provider]  brompheniramine-pseudoephedrine-DM 30-2-10 MG/5ML syrup Take 10 mLs by mouth 4 (four) times daily as needed for up to 7 days. 06/03/21 06/10/21 Yes Laurene Footman B, PA-C  diazepam (VALIUM) 5 MG tablet Take 5 mg by mouth every 6 (six) hours as needed for anxiety.   Yes [provider]  Melatonin 10 MG CAPS Take by mouth at bedtime.   Yes [provider]  traZODone (DESYREL) 50 MG tablet  03/09/19  Yes [provider]    Family History No family history on file.  Social History Social History   Tobacco Use   Smoking  status: Never   Smokeless tobacco: Never  Vaping Use   Vaping Use: Never used  Substance Use Topics   Alcohol use: Yes   Drug use: Never     Allergies   Other   Review of Systems Review of Systems  Constitutional:  Positive for fatigue. Negative for chills, diaphoresis and fever.  HENT:  Positive for congestion, rhinorrhea and sore throat. Negative for ear pain, sinus pressure and sinus pain.   Respiratory:  Positive for cough. Negative for shortness of breath.   Gastrointestinal:  Negative for abdominal pain, nausea and vomiting.  Musculoskeletal:  Negative for arthralgias and myalgias.  Skin:  Negative for rash.  Neurological:  Positive for headaches. Negative for weakness.  Hematological:  Negative for adenopathy.    Physical Exam Triage Vital Signs ED Triage Vitals  Enc Vitals Group     BP 06/03/21 1234 99/70     Pulse Rate 06/03/21 1234 73     Resp 06/03/21 1234 16     Temp 06/03/21 1234 97.8 F (36.6 C)     Temp Source 06/03/21 1234 Oral     SpO2 06/03/21 1234 99 %     Weight 06/03/21 1226 182 lb (82.6 kg)     Height 06/03/21 1226 5\' 11"  (1.803 m)     Head Circumference --      Peak Flow --  Pain Score 06/03/21 1226 5     Pain Loc --      Pain Edu? --      Excl. in [place]? --    No data found.  Updated Vital Signs BP 99/70 (BP Location: Left Arm)   Pulse 73   Temp 97.8 F (36.6 C) (Oral)   Resp 16   Ht 5\' 11"  (1.803 m)   Wt 182 lb (82.6 kg)   LMP 05/03/2021 (Exact Date)   SpO2 99%   BMI 25.38 kg/m   Physical Exam Vitals and nursing note reviewed.  Constitutional:      General: She is not in acute distress.    Appearance: Normal appearance. She is not ill-appearing or toxic-appearing.  HENT:     Head: Normocephalic and atraumatic.     Right Ear: Tympanic membrane, ear canal and external ear normal.     Left Ear: Tympanic membrane, ear canal and external ear normal.     Nose: Congestion present.     Mouth/Throat:     Mouth: Mucous membranes  are moist.     Pharynx: Oropharynx is clear. Posterior oropharyngeal erythema present.  Eyes:     General: No scleral icterus.       Right eye: No discharge.        Left eye: No discharge.     Conjunctiva/sclera: Conjunctivae normal.  Cardiovascular:     Rate and Rhythm: Normal rate and regular rhythm.     Heart sounds: Normal heart sounds.  Pulmonary:     Effort: Pulmonary effort is normal. No respiratory distress.     Breath sounds: Normal breath sounds.  Musculoskeletal:     Cervical back: Neck supple.  Skin:    General: Skin is dry.  Neurological:     General: No focal deficit present.     Mental Status: She is alert. Mental status is at baseline.     Motor: No weakness.     Gait: Gait normal.  Psychiatric:        Mood and Affect: Mood normal.        Behavior: Behavior normal.        Thought Content: Thought content normal.     UC Treatments / Results  Labs (all labs ordered are listed, but only abnormal results are displayed) Labs Reviewed  RESP PANEL BY RT-PCR (FLU A&B, COVID) [id number]    EKG   Radiology No results found.  Procedures Procedures (including critical care time)  Medications Ordered in UC Medications - No data to display  Initial Impression / Assessment and Plan / UC Course  I have reviewed the triage vital signs and the nursing notes.  Pertinent labs & imaging results that were available during my care of the patient were reviewed by me and considered in my medical decision making (see chart for details).  50 year old female presenting for 4-day history of fatigue, cough, congestion and sore throat.  Also admits to headaches.  COVID exposure.  Vitals normal and stable and she is overall well-appearing.  Nasal congestion on exam and mild posterior pharyngeal erythema.  Chest clear to auscultation heart regular rate and rhythm.  Respiratory panel obtained at patient request to assess for influenza and COVID-19.  Reviewed CDC guidelines,  isolation protocol and ED precautions if COVID-positive.  Advised her that she would be out of the window for treatment with antiviral medication if she had COVID or flu.  Presentation consistent with viral illness.  Supportive care encouraged.  I have sent  Bromfed-DM and encouraged increasing rest and fluids.  Reviewed return and ED precautions.  Final Clinical Impressions(s) / UC Diagnoses   Final diagnoses:  Viral illness  Acute cough  Nasal congestion  Close exposure to COVID-19 virus     Discharge Instructions      URI/COLD SYMPTOMS: Your exam today is consistent with a viral illness. Antibiotics are not indicated at this time. Use medications as directed, including cough syrup, nasal saline, and decongestants. Your symptoms should improve over the next few days and resolve within 7-14 days. Increase rest and fluids. F/u if symptoms worsen or predominate such as sore throat, ear pain, productive cough, shortness of breath, or if you develop high fevers or worsening fatigue over the next several days.    You have received COVID testing today either for positive exposure, concerning symptoms that could be related to COVID infection, screening purposes, or re-testing after confirmed positive.  Your test obtained today checks for active viral infection in the last 1-2 weeks. If your test is negative now, you can still test positive later. So, if you do develop symptoms you should either get re-tested and/or isolate x 5 days and then strict mask use x 5 days (unvaccinated) or mask use x 10 days (vaccinated). Please follow CDC guidelines.  While Rapid antigen tests come back in 15-20 minutes, send out PCR/molecular test results typically come back within 1-3 days. In the mean time, if you are symptomatic, assume this could be a positive test and treat/monitor yourself as if you do have COVID.   We will call with test results if positive. Please download the MyChart app and set up a profile to  access test results.   If symptomatic, go home and rest. Push fluids. Take Tylenol as needed for discomfort. Gargle warm salt water. Throat lozenges. Take Mucinex DM or Robitussin for cough. Humidifier in bedroom to ease coughing. Warm showers. Also review the COVID handout for more information.  COVID-19 INFECTION: The incubation period of COVID-19 is approximately 14 days after exposure, with most symptoms developing in roughly 4-5 days. Symptoms may range in severity from mild to critically severe. Roughly 80% of those infected will have mild symptoms. People of any age may become infected with COVID-19 and have the ability to transmit the virus. The most common symptoms include: fever, fatigue, cough, body aches, headaches, sore throat, nasal congestion, shortness of breath, nausea, vomiting, diarrhea, changes in smell and/or taste.    COURSE OF ILLNESS Some patients may begin with mild disease which can progress quickly into critical symptoms. If your symptoms are worsening please call ahead to the Emergency Department and proceed there for further treatment. Recovery time appears to be roughly 1-2 weeks for mild symptoms and 3-6 weeks for severe disease.   GO IMMEDIATELY TO ER FOR FEVER YOU ARE UNABLE TO GET DOWN WITH TYLENOL, BREATHING PROBLEMS, CHEST PAIN, FATIGUE, LETHARGY, INABILITY TO EAT OR DRINK, ETC  QUARANTINE AND ISOLATION: To help decrease the spread of COVID-19 please remain isolated if you have COVID infection or are highly suspected to have COVID infection. This means -stay home and isolate to one room in the home if you live with others. Do not share a bed or bathroom with others while ill, sanitize and wipe down all countertops and keep common areas clean and disinfected. Stay home for 5 days. If you have no symptoms or your symptoms are resolving after 5 days, you can leave your house. Continue to wear a mask around others  for 5 additional days. If you have been in close contact  (within 6 feet) of someone diagnosed with COVID 19, you are advised to quarantine in your home for 14 days as symptoms can develop anywhere from 2-14 days after exposure to the virus. If you develop symptoms, you  must isolate.  Most current guidelines for COVID after exposure -unvaccinated: isolate 5 days and strict mask use x 5 days. Test on day 5 is possible -vaccinated: wear mask x 10 days if symptoms do not develop -You do not necessarily need to be tested for COVID if you have + exposure and  develop symptoms. Just isolate at home x10 days from symptom onset During this global pandemic, CDC advises to practice social distancing, try to stay at least 30ft away from others at all times. Wear a face covering. Wash and sanitize your hands regularly and avoid going anywhere that is not necessary.  KEEP IN MIND THAT THE COVID TEST IS NOT 100% ACCURATE AND YOU SHOULD STILL DO EVERYTHING TO PREVENT POTENTIAL SPREAD OF VIRUS TO OTHERS (WEAR MASK, WEAR GLOVES, [place] HANDS AND SANITIZE REGULARLY). IF INITIAL TEST IS NEGATIVE, THIS MAY NOT MEAN YOU ARE DEFINITELY NEGATIVE. MOST ACCURATE TESTING IS DONE 5-7 DAYS AFTER EXPOSURE.   It is not advised by CDC to get re-tested after receiving a positive COVID test since you can still test positive for weeks to months after you have already cleared the virus.   *If you have not been vaccinated for COVID, I strongly suggest you consider getting vaccinated as long as there are no contraindications.       ED Prescriptions     Medication Sig Dispense Auth. Provider   brompheniramine-pseudoephedrine-DM 30-2-10 MG/5ML syrup Take 10 mLs by mouth 4 (four) times daily as needed for up to 7 days. 150 mL Danton Clap, PA-C      PDMP not reviewed this encounter.   Danton Clap, PA-C 06/03/21 1310

## 2021-06-03 NOTE — ED Triage Notes (Signed)
Pt states sore throat  nasal congestion x 4 days, fatigue, covid test negative at home. Headache x 1day, chest congestion x 1 day

## 2021-06-03 NOTE — Discharge Instructions (Signed)
URI/COLD SYMPTOMS: Your exam today is consistent with a viral illness. Antibiotics are not indicated at this time. Use medications as directed, including cough syrup, nasal saline, and decongestants. Your symptoms should improve over the next few days and resolve within 7-14 days. Increase rest and fluids. F/u if symptoms worsen or predominate such as sore throat, ear pain, productive cough, shortness of breath, or if you develop high fevers or worsening fatigue over the next several days.    You have received COVID testing today either for positive exposure, concerning symptoms that could be related to COVID infection, screening purposes, or re-testing after confirmed positive.  Your test obtained today checks for active viral infection in the last 1-2 weeks. If your test is negative now, you can still test positive later. So, if you do develop symptoms you should either get re-tested and/or isolate x 5 days and then strict mask use x 5 days (unvaccinated) or mask use x 10 days (vaccinated). Please follow CDC guidelines.  While Rapid antigen tests come back in 15-20 minutes, send out PCR/molecular test results typically come back within 1-3 days. In the mean time, if you are symptomatic, assume this could be a positive test and treat/monitor yourself as if you do have COVID.   We will call with test results if positive. Please download the MyChart app and set up a profile to access test results.   If symptomatic, go home and rest. Push fluids. Take Tylenol as needed for discomfort. Gargle warm salt water. Throat lozenges. Take Mucinex DM or Robitussin for cough. Humidifier in bedroom to ease coughing. Warm showers. Also review the COVID handout for more information.  COVID-19 INFECTION: The incubation period of COVID-19 is approximately 14 days after exposure, with most symptoms developing in roughly 4-5 days. Symptoms may range in severity from mild to critically severe. Roughly 80% of those infected  will have mild symptoms. People of any age may become infected with COVID-19 and have the ability to transmit the virus. The most common symptoms include: fever, fatigue, cough, body aches, headaches, sore throat, nasal congestion, shortness of breath, nausea, vomiting, diarrhea, changes in smell and/or taste.    COURSE OF ILLNESS Some patients may begin with mild disease which can progress quickly into critical symptoms. If your symptoms are worsening please call ahead to the Emergency Department and proceed there for further treatment. Recovery time appears to be roughly 1-2 weeks for mild symptoms and 3-6 weeks for severe disease.   GO IMMEDIATELY TO ER FOR FEVER YOU ARE UNABLE TO GET DOWN WITH TYLENOL, BREATHING PROBLEMS, CHEST PAIN, FATIGUE, LETHARGY, INABILITY TO EAT OR DRINK, ETC  QUARANTINE AND ISOLATION: To help decrease the spread of COVID-19 please remain isolated if you have COVID infection or are highly suspected to have COVID infection. This means -stay home and isolate to one room in the home if you live with others. Do not share a bed or bathroom with others while ill, sanitize and wipe down all countertops and keep common areas clean and disinfected. Stay home for 5 days. If you have no symptoms or your symptoms are resolving after 5 days, you can leave your house. Continue to wear a mask around others for 5 additional days. If you have been in close contact (within 6 feet) of someone diagnosed with COVID 19, you are advised to quarantine in your home for 14 days as symptoms can develop anywhere from 2-14 days after exposure to the virus. If you develop symptoms, you  must isolate.  Most current guidelines for COVID after exposure -unvaccinated: isolate 5 days and strict mask use x 5 days. Test on day 5 is possible -vaccinated: wear mask x 10 days if symptoms do not develop -You do not necessarily need to be tested for COVID if you have + exposure and  develop symptoms. Just isolate at  home x10 days from symptom onset During this global pandemic, CDC advises to practice social distancing, try to stay at least 107ft away from others at all times. Wear a face covering. Wash and sanitize your hands regularly and avoid going anywhere that is not necessary.  KEEP IN MIND THAT THE COVID TEST IS NOT 100% ACCURATE AND YOU SHOULD STILL DO EVERYTHING TO PREVENT POTENTIAL SPREAD OF VIRUS TO OTHERS (WEAR MASK, WEAR GLOVES, [place] HANDS AND SANITIZE REGULARLY). IF INITIAL TEST IS NEGATIVE, THIS MAY NOT MEAN YOU ARE DEFINITELY NEGATIVE. MOST ACCURATE TESTING IS DONE 5-7 DAYS AFTER EXPOSURE.   It is not advised by CDC to get re-tested after receiving a positive COVID test since you can still test positive for weeks to months after you have already cleared the virus.   *If you have not been vaccinated for COVID, I strongly suggest you consider getting vaccinated as long as there are no contraindications.

## 2021-07-27 DIAGNOSIS — S63501A Unspecified sprain of right wrist, initial encounter: Secondary | ICD-10-CM | POA: Diagnosis not present

## 2021-08-06 DIAGNOSIS — F331 Major depressive disorder, recurrent, moderate: Secondary | ICD-10-CM | POA: Diagnosis not present

## 2021-08-06 DIAGNOSIS — F900 Attention-deficit hyperactivity disorder, predominantly inattentive type: Secondary | ICD-10-CM | POA: Diagnosis not present

## 2021-08-06 DIAGNOSIS — F411 Generalized anxiety disorder: Secondary | ICD-10-CM | POA: Diagnosis not present

## 2021-08-15 DIAGNOSIS — W1842XA Slipping, tripping and stumbling without falling due to stepping into hole or opening, initial encounter: Secondary | ICD-10-CM | POA: Insufficient documentation

## 2021-08-15 DIAGNOSIS — S93602A Unspecified sprain of left foot, initial encounter: Secondary | ICD-10-CM | POA: Diagnosis not present

## 2021-08-15 DIAGNOSIS — Y9389 Activity, other specified: Secondary | ICD-10-CM | POA: Diagnosis not present

## 2021-08-15 DIAGNOSIS — Z9889 Other specified postprocedural states: Secondary | ICD-10-CM | POA: Diagnosis not present

## 2021-08-15 DIAGNOSIS — S9032XA Contusion of left foot, initial encounter: Secondary | ICD-10-CM | POA: Diagnosis not present

## 2021-08-15 DIAGNOSIS — S99922A Unspecified injury of left foot, initial encounter: Secondary | ICD-10-CM | POA: Diagnosis not present

## 2021-08-16 ENCOUNTER — Ambulatory Visit (INDEPENDENT_AMBULATORY_CARE_PROVIDER_SITE_OTHER): Payer: BC Managed Care – PPO | Admitting: Orthopedic Surgery

## 2021-08-16 ENCOUNTER — Encounter: Payer: Self-pay | Admitting: Emergency Medicine

## 2021-08-16 ENCOUNTER — Emergency Department
Admission: EM | Admit: 2021-08-16 | Discharge: 2021-08-16 | Disposition: A | Discharge status: Home / Self Care | Payer: BC Managed Care – PPO | Attending: Emergency Medicine | Admitting: Emergency Medicine

## 2021-08-16 ENCOUNTER — Other Ambulatory Visit: Payer: Self-pay

## 2021-08-16 ENCOUNTER — Emergency Department: Payer: BC Managed Care – PPO

## 2021-08-16 DIAGNOSIS — S93602A Unspecified sprain of left foot, initial encounter: Secondary | ICD-10-CM | POA: Diagnosis not present

## 2021-08-16 DIAGNOSIS — S99922A Unspecified injury of left foot, initial encounter: Secondary | ICD-10-CM | POA: Diagnosis not present

## 2021-08-16 DIAGNOSIS — S93622A Sprain of tarsometatarsal ligament of left foot, initial encounter: Secondary | ICD-10-CM

## 2021-08-16 DIAGNOSIS — S9032XA Contusion of left foot, initial encounter: Secondary | ICD-10-CM

## 2021-08-16 DIAGNOSIS — Z9889 Other specified postprocedural states: Secondary | ICD-10-CM | POA: Diagnosis not present

## 2021-08-16 MED ORDER — IBUPROFEN 800 MG PO TABS: 800.0000 mg | ORAL_TABLET | Freq: Three times a day (TID) | ORAL | 0 refills | Status: DC | PRN

## 2021-08-16 MED ORDER — HYDROCODONE-ACETAMINOPHEN 5-325 MG PO TABS: 1.0000 | ORAL_TABLET | Freq: Four times a day (QID) | ORAL | 0 refills | Status: DC | PRN

## 2021-08-16 MED ORDER — IBUPROFEN 800 MG PO TABS
800.0000 mg | ORAL_TABLET | Freq: Once | ORAL | Status: AC
  Administered 2021-08-16: 03:00:00 800 mg via ORAL
  Filled 2021-08-16: qty 1

## 2021-08-16 MED ORDER — HYDROCODONE-ACETAMINOPHEN 5-325 MG PO TABS
1.0000 | ORAL_TABLET | Freq: Once | ORAL | Status: AC
  Administered 2021-08-16: 03:00:00 1 via ORAL
  Filled 2021-08-16: qty 1

## 2021-08-16 NOTE — ED Provider Notes (Signed)
[facility] Emergency Department Provider Note   ____________________________________________   Event Date/Time   First MD Initiated Contact with Patient 08/16/21 [phone]     (approximate)  I have reviewed the triage vital signs and the nursing notes.   HISTORY  Chief Complaint Foot Injury    HPI Brittney Alvarez is a 50 y.o. female who presents to the ED from home with a chief complaint of left foot injury.  Patient was putting her dogs in their crates and slipped down 3 steps, twisting her left foot.  Incident occurred approximately 8 PM and she has been icing it.  Painful to bear weight.  Denies numbness/tingling.  Voices no other complaints or injuries.      Past medical history None  Patient Active Problem List   Diagnosis Date Noted   ADD (attention deficit disorder) 04/09/2019   Menorrhagia with regular cycle 04/09/2019   Lesion of radial nerve 12/15/2018   Occipital neuralgia of left side 01/30/2018   Right lateral epicondylitis 11/19/2017    History reviewed. No pertinent surgical history.  Prior to Admission medications   Medication Sig Start Date End Date Taking? Authorizing Provider  HYDROcodone-acetaminophen (NORCO) 5-325 MG tablet Take 1 tablet by mouth every 6 (six) hours as needed for moderate pain. 08/16/21  Yes Paulette Blanch, MD  ibuprofen (ADVIL) 800 MG tablet Take 1 tablet (800 mg total) by mouth every 8 (eight) hours as needed for moderate pain. 08/16/21  Yes Paulette Blanch, MD  albuterol (VENTOLIN HFA) 108 [phone] Base) MCG/ACT inhaler  11/20/17   [provider]  amphetamine-dextroamphetamine (ADDERALL) 10 MG tablet Take by mouth.    [provider]  diazepam (VALIUM) 5 MG tablet Take 5 mg by mouth every 6 (six) hours as needed for anxiety.    [provider]  Melatonin 10 MG CAPS Take by mouth at bedtime.    [provider]  traZODone (DESYREL) 50 MG tablet  03/09/19   [provider]     Allergies Other  No family history on file.  Social History Social History   Tobacco Use   Smoking status: Never   Smokeless tobacco: Never  Vaping Use   Vaping Use: Never used  Substance Use Topics   Alcohol use: Yes   Drug use: Never    Review of Systems  Constitutional: No fever/chills Eyes: No visual changes. ENT: No sore throat. Cardiovascular: Denies chest pain. Respiratory: Denies shortness of breath. Gastrointestinal: No abdominal pain.  No nausea, no vomiting.  No diarrhea.  No constipation. Genitourinary: Negative for dysuria. Musculoskeletal: Positive for left foot injury.  Negative for back pain. Skin: Negative for rash. Neurological: Negative for headaches, focal weakness or numbness.   ____________________________________________   PHYSICAL EXAM:  VITAL SIGNS: ED Triage Vitals  Enc Vitals Group     BP 08/16/21 0005 124/86     Pulse Rate 08/16/21 0005 98     Resp 08/16/21 0005 18     Temp 08/16/21 0005 98 F (36.7 C)     Temp Source 08/16/21 0005 Oral     SpO2 08/16/21 0005 94 %     Weight 08/16/21 0002 185 lb (83.9 kg)     Height 08/16/21 0002 5\' 11"  (1.803 m)     Head Circumference --      Peak Flow --      Pain Score 08/16/21 0002 7     Pain Loc --      Pain Edu? --  Excl. in [place]? --     Constitutional: Alert and oriented. Well appearing and in no acute distress. Eyes: Conjunctivae are normal. PERRL. EOMI. Head: Atraumatic. Nose: Atraumatic. Mouth/Throat: Mucous membranes are moist.  No dental malocclusion. Neck: No stridor.  No cervical spine tenderness to palpation. Cardiovascular: Normal rate, regular rhythm. Grossly normal heart sounds.  Good peripheral circulation. Respiratory: Normal respiratory effort.  No retractions. Lungs CTAB. Gastrointestinal: Soft and nontender. No distention. No abdominal bruits. No CVA tenderness. Musculoskeletal:  LLE: No ankle tenderness.  Tender to palpation dorsal foot with limited range of  motion secondary to pain.  Minor abrasions across all dorsal toes.  2+ distal pulses.  Brisk, less than 5-second capillary refill. Neurologic:  Normal speech and language. No gross focal neurologic deficits are appreciated.  Skin:  Skin is warm, dry and intact. No rash noted. Psychiatric: Mood and affect are normal. Speech and behavior are normal.  ____________________________________________   LABS (all labs ordered are listed, but only abnormal results are displayed)  Labs Reviewed - No data to display ____________________________________________  EKG  None ____________________________________________  RADIOLOGY I, Xachary Hambly J, personally viewed and evaluated these images (plain radiographs) as part of my medical decision making, as well as reviewing the written report by the radiologist.  ED MD interpretation: No acute bony abnormalities  Official radiology report(s): DG Foot Complete Left  Result Date: 08/16/2021 CLINICAL DATA:  [place], injury EXAM: LEFT FOOT - COMPLETE 3+ VIEW COMPARISON:  07/12/2019 FINDINGS: Frontal, oblique, lateral views of the left foot are obtained. Stable postsurgical changes of the first metatarsal. No evidence of orthopedic hardware failure or loosening. No acute fracture, subluxation, or dislocation. Joint spaces are well preserved. Soft tissues are normal. IMPRESSION: 1. No acute bony abnormality. 2. Stable postsurgical changes first metatarsal. Electronically Signed   By: Randa Ngo M.D.   On: 08/16/2021 00:34    ____________________________________________   PROCEDURES  Procedure(s) performed (including Critical Care):  Procedures   ____________________________________________   INITIAL IMPRESSION / ASSESSMENT AND PLAN / ED COURSE  As part of my medical decision making, I reviewed the following data within the [facility] notes reviewed and incorporated, Radiograph reviewed, Notes from prior ED visits, and [place]  Controlled Substance Database     50 year old female presenting with left foot injury.  X-rays negative for acute osseous injuries.  Will place in podiatric shoe, provide crutches, start NSAIDs and analgesia.  Patient requesting follow-up with Dr. Ninfa Linden with [facility] orthopedics.  Strict return precautions given.  Patient verbalizes understanding agrees with plan of care.      ____________________________________________   FINAL CLINICAL IMPRESSION(S) / ED DIAGNOSES  Final diagnoses:  Contusion of left foot, initial encounter  Injury of left foot, initial encounter  Sprain of left foot, initial encounter     ED Discharge Orders          Ordered    ibuprofen (ADVIL) 800 MG tablet  Every 8 hours PRN        08/16/21 0257    HYDROcodone-acetaminophen (NORCO) 5-325 MG tablet  Every 6 hours PRN        08/16/21 0257             Note:  This document was prepared using Dragon voice recognition software and may include unintentional dictation errors.    Paulette Blanch, MD 08/16/21 [phone]

## 2021-08-16 NOTE — Discharge Instructions (Addendum)
1.  You may take pain medicines as needed (Motrin/Norco #15). 2.  Elevate affected area several times daily. 3.  Use podiatric shoe and crutches as needed. 4.  Return to the ER for worsening symptoms, persistent vomiting, difficulty breathing or other concerns.

## 2021-08-16 NOTE — ED Triage Notes (Signed)
Pt to triage via w/c with no distress noted; st slipped down steps injury left foot

## 2021-08-17 DIAGNOSIS — N921 Excessive and frequent menstruation with irregular cycle: Secondary | ICD-10-CM | POA: Diagnosis not present

## 2021-08-17 DIAGNOSIS — N898 Other specified noninflammatory disorders of vagina: Secondary | ICD-10-CM | POA: Diagnosis not present

## 2021-08-21 ENCOUNTER — Encounter: Payer: Self-pay | Admitting: Orthopedic Surgery

## 2021-08-21 NOTE — Progress Notes (Signed)
° °  Office Visit Note   Patient: Cora Brierley           Date of Birth: Dec 04, 1970           MRN: [id number] Visit Date: 08/16/2021              Requested by: Angelina Sheriff, MD [address] [place],  [place] [address] PCP: Angelina Sheriff, MD  Chief Complaint  Patient presents with   Left Foot - Injury     Legs.  We will obtain bilateral foot standing radiographs at follow-up. HPI: Patient is a 51 year old woman is seen for initial injury for left midfoot injury secondary to a fall last night.  Patient has been on crutches.  Radiographs were negative for fracture.  Assessment & Plan: Visit Diagnoses:  1. Lisfranc's sprain, left, initial encounter     Plan: We will place her in a fracture boot continue nonweightbearing discussed that she has an injury of the Lisfranc,  Follow-Up Instructions: Return in about 3 weeks (around 09/06/2021).   Ortho Exam  Patient is alert, oriented, no adenopathy, well-dressed, normal affect, normal respiratory effort. Examination patient has a good pulse.  There is no blisters no ecchymosis.  Patient has pain with distraction across the base of the first metatarsal pain with distraction across the Lisfranc complex.  Nonweightbearing radiograph shows no evidence of dislocation across the Lisfranc complex.  Imaging: No results found. No images are attached to the encounter.  Labs: No results found for: HGBA1C, ESRSEDRATE, CRP, LABURIC, REPTSTATUS, GRAMSTAIN, CULT, LABORGA   No results found for: ALBUMIN, PREALBUMIN, CBC  No results found for: MG No results found for: VD25OH  No results found for: PREALBUMIN No flowsheet data found.   There is no height or weight on file to calculate BMI.  Orders:  No orders of the defined types were placed in this encounter.  No orders of the defined types were placed in this encounter.    Procedures: No procedures performed  Clinical Data: No additional findings.  ROS:  All other  systems negative, except as noted in the HPI. Review of Systems  Objective: Vital Signs: LMP 08/08/2021 (Exact Date)   Specialty Comments:  No specialty comments available.  PMFS History: Patient Active Problem List   Diagnosis Date Noted   ADD (attention deficit disorder) 04/09/2019   Menorrhagia with regular cycle 04/09/2019   Lesion of radial nerve 12/15/2018   Occipital neuralgia of left side 01/30/2018   Right lateral epicondylitis 11/19/2017   History reviewed. No pertinent past medical history.  History reviewed. No pertinent family history.  History reviewed. No pertinent surgical history. Social History   Occupational History   Not on file  Tobacco Use   Smoking status: Never   Smokeless tobacco: Never  Vaping Use   Vaping Use: Never used  Substance and Sexual Activity   Alcohol use: Yes   Drug use: Never   Sexual activity: Not on file

## 2021-08-29 DIAGNOSIS — H01135 Eczematous dermatitis of left lower eyelid: Secondary | ICD-10-CM | POA: Diagnosis not present

## 2021-09-06 ENCOUNTER — Ambulatory Visit (INDEPENDENT_AMBULATORY_CARE_PROVIDER_SITE_OTHER): Payer: BC Managed Care – PPO

## 2021-09-06 ENCOUNTER — Ambulatory Visit (INDEPENDENT_AMBULATORY_CARE_PROVIDER_SITE_OTHER): Payer: BC Managed Care – PPO | Admitting: Orthopedic Surgery

## 2021-09-06 ENCOUNTER — Encounter: Payer: Self-pay | Admitting: Orthopedic Surgery

## 2021-09-06 ENCOUNTER — Other Ambulatory Visit: Payer: Self-pay

## 2021-09-06 DIAGNOSIS — S93622A Sprain of tarsometatarsal ligament of left foot, initial encounter: Secondary | ICD-10-CM | POA: Diagnosis not present

## 2021-09-06 DIAGNOSIS — M79672 Pain in left foot: Secondary | ICD-10-CM

## 2021-09-06 NOTE — Progress Notes (Signed)
° °  Office Visit Note   Patient: Brittney Alvarez           Date of Birth: [birth year]           MRN: [id number] Visit Date: 09/06/2021              Requested by: Angelina Sheriff, MD [address] [place],  [place] [address] PCP: Angelina Sheriff, MD  Chief Complaint  Patient presents with   Left Foot - Follow-up      HPI: Patient is a 51 year old woman who presents in follow-up for Lisfranc sprain left foot she is currently on a kneeling scooter nonweightbearing in a fracture boot.  She states she still has pain across the midfoot.  Assessment & Plan: Visit Diagnoses:  1. Pain in left foot   2. Lisfranc's sprain, left, initial encounter     Plan: We will continue with protected weightbearing for 4 more weeks repeat radiographs at follow-up.  Discussed the potential for fusion across the Lisfranc complex.  Recommended nutrition supplements including vitamin D3 omega-3 and protein supplement.  Follow-Up Instructions: Return in about 4 weeks (around 10/04/2021).   Ortho Exam  Patient is alert, oriented, no adenopathy, well-dressed, normal affect, normal respiratory effort. Examination patient has a good pulse there is no redness cellulitis or bruising.  Patient has pain with distraction across the base of the second metatarsal and pain with distraction across the base of the first metatarsal.  Radiograph shows no displacement.  Imaging: XR Foot 2 Views Left  Result Date: 09/06/2021 Standing radiographs of the left foot shows no widening of the Lisfranc complex.  No avulsion fractures.  No images are attached to the encounter.  Labs: No results found for: HGBA1C, ESRSEDRATE, CRP, LABURIC, REPTSTATUS, GRAMSTAIN, CULT, LABORGA   No results found for: ALBUMIN, PREALBUMIN, CBC  No results found for: MG No results found for: VD25OH  No results found for: PREALBUMIN No flowsheet data found.   There is no height or weight on file to calculate BMI.  Orders:  Orders Placed  This Encounter  Procedures   XR Foot 2 Views Left   No orders of the defined types were placed in this encounter.    Procedures: No procedures performed  Clinical Data: No additional findings.  ROS:  All other systems negative, except as noted in the HPI. Review of Systems  Objective: Vital Signs: LMP 08/08/2021 (Exact Date)   Specialty Comments:  No specialty comments available.  PMFS History: Patient Active Problem List   Diagnosis Date Noted   ADD (attention deficit disorder) 04/09/2019   Menorrhagia with regular cycle 04/09/2019   Lesion of radial nerve 12/15/2018   Occipital neuralgia of left side 01/30/2018   Right lateral epicondylitis 11/19/2017   History reviewed. No pertinent past medical history.  History reviewed. No pertinent family history.  History reviewed. No pertinent surgical history. Social History   Occupational History   Not on file  Tobacco Use   Smoking status: Never   Smokeless tobacco: Never  Vaping Use   Vaping Use: Never used  Substance and Sexual Activity   Alcohol use: Yes   Drug use: Never   Sexual activity: Not on file

## 2021-09-17 DIAGNOSIS — N921 Excessive and frequent menstruation with irregular cycle: Secondary | ICD-10-CM | POA: Diagnosis not present

## 2021-09-17 DIAGNOSIS — Z30011 Encounter for initial prescription of contraceptive pills: Secondary | ICD-10-CM | POA: Diagnosis not present

## 2021-09-17 DIAGNOSIS — N939 Abnormal uterine and vaginal bleeding, unspecified: Secondary | ICD-10-CM | POA: Diagnosis not present

## 2021-09-18 DIAGNOSIS — H5213 Myopia, bilateral: Secondary | ICD-10-CM | POA: Diagnosis not present

## 2021-09-18 DIAGNOSIS — H10413 Chronic giant papillary conjunctivitis, bilateral: Secondary | ICD-10-CM | POA: Diagnosis not present

## 2021-09-27 ENCOUNTER — Ambulatory Visit (INDEPENDENT_AMBULATORY_CARE_PROVIDER_SITE_OTHER): Payer: BC Managed Care – PPO | Admitting: Orthopedic Surgery

## 2021-09-27 ENCOUNTER — Other Ambulatory Visit: Payer: Self-pay

## 2021-09-27 ENCOUNTER — Ambulatory Visit: Payer: Self-pay

## 2021-09-27 ENCOUNTER — Encounter: Payer: Self-pay | Admitting: Orthopedic Surgery

## 2021-09-27 DIAGNOSIS — S93622A Sprain of tarsometatarsal ligament of left foot, initial encounter: Secondary | ICD-10-CM

## 2021-09-27 NOTE — Progress Notes (Signed)
° °  Office Visit Note   Patient: Brittney Alvarez           Date of Birth: 1970-11-08           MRN: [id number] Visit Date: 09/27/2021              Requested by: Angelina Sheriff, MD [address] [place],  [place] [address] PCP: Angelina Sheriff, MD  Chief Complaint  Patient presents with   Left Foot - Injury, Follow-up      HPI: Patient is a 51 year old woman who presents status post left foot Lisfranc sprain.  She has been using a kneeling scooter in the fracture boot.  Assessment & Plan: Visit Diagnoses:  1. Lisfranc's sprain, left, initial encounter     Plan: Patient will advance to a stiff soled shoe such as a hiking shoe.  She will work on range of motion of her toes and ankle and increase weightbearing as tolerated.  Three-view radiographs of the left foot at follow-up.  Follow-Up Instructions: Return in about 4 weeks (around 10/25/2021).   Ortho Exam  Patient is alert, oriented, no adenopathy, well-dressed, normal affect, normal respiratory effort. Examination patient's foot is much less tender to palpation across the Lisfranc complex distraction is not symptomatic.  Imaging: XR Foot 2 Views Left  Result Date: 09/27/2021 2 view radiographs of the left foot shows no displacement across the Lisfranc complex or acute fracture.  No images are attached to the encounter.  Labs: No results found for: HGBA1C, ESRSEDRATE, CRP, LABURIC, REPTSTATUS, GRAMSTAIN, CULT, LABORGA   No results found for: ALBUMIN, PREALBUMIN, CBC  No results found for: MG No results found for: VD25OH  No results found for: PREALBUMIN No flowsheet data found.   There is no height or weight on file to calculate BMI.  Orders:  Orders Placed This Encounter  Procedures   XR Foot 2 Views Left   No orders of the defined types were placed in this encounter.    Procedures: No procedures performed  Clinical Data: No additional findings.  ROS:  All other systems negative, except as  noted in the HPI. Review of Systems  Objective: Vital Signs: There were no vitals taken for this visit.  Specialty Comments:  No specialty comments available.  PMFS History: Patient Active Problem List   Diagnosis Date Noted   ADD (attention deficit disorder) 04/09/2019   Menorrhagia with regular cycle 04/09/2019   Lesion of radial nerve 12/15/2018   Occipital neuralgia of left side 01/30/2018   Right lateral epicondylitis 11/19/2017   History reviewed. No pertinent past medical history.  History reviewed. No pertinent family history.  History reviewed. No pertinent surgical history. Social History   Occupational History   Not on file  Tobacco Use   Smoking status: Never   Smokeless tobacco: Never  Vaping Use   Vaping Use: Never used  Substance and Sexual Activity   Alcohol use: Yes   Drug use: Never   Sexual activity: Not on file

## 2021-10-25 ENCOUNTER — Ambulatory Visit: Payer: BC Managed Care – PPO | Admitting: Orthopedic Surgery

## 2021-11-01 ENCOUNTER — Ambulatory Visit (INDEPENDENT_AMBULATORY_CARE_PROVIDER_SITE_OTHER): Payer: BC Managed Care – PPO | Admitting: Orthopedic Surgery

## 2021-11-01 ENCOUNTER — Ambulatory Visit (INDEPENDENT_AMBULATORY_CARE_PROVIDER_SITE_OTHER): Payer: BC Managed Care – PPO

## 2021-11-01 DIAGNOSIS — S93622A Sprain of tarsometatarsal ligament of left foot, initial encounter: Secondary | ICD-10-CM | POA: Diagnosis not present

## 2021-11-04 ENCOUNTER — Encounter: Payer: Self-pay | Admitting: Orthopedic Surgery

## 2021-11-04 NOTE — Progress Notes (Signed)
? ?  Office Visit Note ?  ?Patient: Brittney Alvarez           ?Date of Birth: 1971-05-30           ?MRN: [id number] ?Visit Date: 11/01/2021 ?             ?Requested by: Angelina Sheriff, MD ?[address],  [place] [address] ?PCP: Angelina Sheriff, MD ? ?Chief Complaint  ?Patient presents with  ? Left Foot - Follow-up  ?  Lisfranc sprain   ? ? ? ? ?HPI: ?Patient is a 51 year old woman who is status post Lisfranc sprain left foot.  Patient has discontinued her fracture boot she has pain with ambulation over the Lisfranc joint. ? ?Assessment & Plan: ?Visit Diagnoses:  ?1. Lisfranc's sprain, left, initial encounter   ? ? ?Plan: Discussed treatment options including nonoperative versus surgical intervention.  Patient states she would like to follow-up in 4 weeks and consider scheduling fusion across the Lisfranc complex in the end of April. ? ?Follow-Up Instructions: Return in about 4 weeks (around 11/29/2021).  ? ?Ortho Exam ? ?Patient is alert, oriented, no adenopathy, well-dressed, normal affect, normal respiratory effort. ?Examination there is no cellulitis she has good pulses she has pain to palpation across the Lisfranc complex and pain with distraction across the Lisfranc complex. ? ?Imaging: ?No results found. ?No images are attached to the encounter. ? ?Labs: ?No results found for: HGBA1C, ESRSEDRATE, CRP, LABURIC, REPTSTATUS, GRAMSTAIN, CULT, LABORGA ? ? ?No results found for: ALBUMIN, PREALBUMIN, CBC ? ?No results found for: MG ?No results found for: VD25OH ? ?No results found for: PREALBUMIN ?No flowsheet data found. ? ? ?There is no height or weight on file to calculate BMI. ? ?Orders:  ?Orders Placed This Encounter  ?Procedures  ? XR Foot Complete Left  ? ?No orders of the defined types were placed in this encounter. ? ? ? Procedures: ?No procedures performed ? ?Clinical Data: ?No additional findings. ? ?ROS: ? ?All other systems negative, except as noted in the HPI. ?Review of  Systems ? ?Objective: ?Vital Signs: There were no vitals taken for this visit. ? ?Specialty Comments:  ?No specialty comments available. ? ?PMFS History: ?Patient Active Problem List  ? Diagnosis Date Noted  ? ADD (attention deficit disorder) 04/09/2019  ? Menorrhagia with regular cycle 04/09/2019  ? Lesion of radial nerve 12/15/2018  ? Occipital neuralgia of left side 01/30/2018  ? Right lateral epicondylitis 11/19/2017  ? ?History reviewed. No pertinent past medical history.  ?History reviewed. No pertinent family history.  ?History reviewed. No pertinent surgical history. ?Social History  ? ?Occupational History  ? Not on file  ?Tobacco Use  ? Smoking status: Never  ? Smokeless tobacco: Never  ?Vaping Use  ? Vaping Use: Never used  ?Substance and Sexual Activity  ? Alcohol use: Yes  ? Drug use: Never  ? Sexual activity: Not on file  ? ? ? ? ? ?

## 2021-11-22 DIAGNOSIS — B9689 Other specified bacterial agents as the cause of diseases classified elsewhere: Secondary | ICD-10-CM | POA: Diagnosis not present

## 2021-11-22 DIAGNOSIS — N760 Acute vaginitis: Secondary | ICD-10-CM | POA: Diagnosis not present

## 2021-11-22 DIAGNOSIS — N898 Other specified noninflammatory disorders of vagina: Secondary | ICD-10-CM | POA: Diagnosis not present

## 2021-11-27 ENCOUNTER — Encounter: Payer: Self-pay | Admitting: Orthopedic Surgery

## 2021-11-27 ENCOUNTER — Ambulatory Visit (INDEPENDENT_AMBULATORY_CARE_PROVIDER_SITE_OTHER): Payer: BC Managed Care – PPO | Admitting: Orthopedic Surgery

## 2021-11-27 DIAGNOSIS — S93622A Sprain of tarsometatarsal ligament of left foot, initial encounter: Secondary | ICD-10-CM | POA: Diagnosis not present

## 2021-11-27 NOTE — Progress Notes (Signed)
? ?  Office Visit Note ?  ?Patient: Brittney Alvarez           ?Date of Birth: 1971/02/11           ?MRN: [id number] ?Visit Date: 11/27/2021 ?             ?Requested by: Angelina Sheriff, MD ?[address],   [address] ?PCP: Angelina Sheriff, MD ? ?Chief Complaint  ?Patient presents with  ? Left Foot - Injury, Follow-up  ?  Lisfranc sprain  ? ? ? ? ?HPI: Patient is a 51 year old woman who presents 3-1/2 months status post Lisfranc injury to her left foot she has been immobilized in a fracture boot.  She states she still has pain with activities of daily living despite conservative therapy. ? ? ?Assessment & Plan: ?Visit Diagnoses:  ?1. Lisfranc's sprain, left, initial encounter   ? ? ?Plan: Patient wishes to proceed with surgical intervention.  We will plan for fusion across the Lisfranc complex.  Plan for outpatient surgery.  Risks and benefits were discussed including the size of the incision, surgical risks, need for nonweightbearing.  Patient states she understands wished to proceed at this time. ? ?Follow-Up Instructions: Return in about 3 weeks (around 12/18/2021).  ? ?Ortho Exam ? ?Patient is alert, oriented, no adenopathy, well-dressed, normal affect, normal respiratory effort. ?Examination patient has a good dorsalis pedis pulse.  Distraction across the base of the first and second metatarsal reproduces pain.  Radiographs have not shown any displacement however patient's pain is centered over the Lisfranc complex and pain is reproduced with stressing the Lisfranc complex. ? ?Imaging: ?No results found. ?No images are attached to the encounter. ? ?Labs: ?No results found for: HGBA1C, ESRSEDRATE, CRP, LABURIC, REPTSTATUS, GRAMSTAIN, CULT, LABORGA ? ? ?No results found for: ALBUMIN, PREALBUMIN, CBC ? ?No results found for: MG ?No results found for: VD25OH ? ?No results found for: PREALBUMIN ?   ? View : No data to display.  ?  ?  ?  ? ? ? ?There is no height or weight on file to calculate  BMI. ? ?Orders:  ?No orders of the defined types were placed in this encounter. ? ?No orders of the defined types were placed in this encounter. ? ? ? Procedures: ?No procedures performed ? ?Clinical Data: ?No additional findings. ? ?ROS: ? ?All other systems negative, except as noted in the HPI. ?Review of Systems ? ?Objective: ?Vital Signs: There were no vitals taken for this visit. ? ?Specialty Comments:  ?No specialty comments available. ? ?PMFS History: ?Patient Active Problem List  ? Diagnosis Date Noted  ? ADD (attention deficit disorder) 04/09/2019  ? Menorrhagia with regular cycle 04/09/2019  ? Lesion of radial nerve 12/15/2018  ? Occipital neuralgia of left side 01/30/2018  ? Right lateral epicondylitis 11/19/2017  ? ?History reviewed. No pertinent past medical history.  ?History reviewed. No pertinent family history.  ?History reviewed. No pertinent surgical history. ?Social History  ? ?Occupational History  ? Not on file  ?Tobacco Use  ? Smoking status: Never  ? Smokeless tobacco: Never  ?Vaping Use  ? Vaping Use: Never used  ?Substance and Sexual Activity  ? Alcohol use: Yes  ? Drug use: Never  ? Sexual activity: Not on file  ? ? ? ? ? ?

## 2021-12-06 ENCOUNTER — Encounter (HOSPITAL_COMMUNITY): Payer: Self-pay | Admitting: Orthopedic Surgery

## 2021-12-06 ENCOUNTER — Other Ambulatory Visit: Payer: Self-pay

## 2021-12-06 NOTE — Progress Notes (Signed)
Brittney Alvarez denies chest pain or shortness of breath.  Patient denies having any s/s of Covid in her household.  Patient denies any known exposure to Covid.  ? ?PCP is Dr. Lovette Cliche. ? ?I instructed Brittney Alvarez to shower with antibacteria soap.  DO not shave. No nail polish, artificial or acrylic nails. Wear clean clothes, brush your teeth. ?Glasses, contact lens,dentures or partials may not be worn in the OR. If you need to wear them, please bring a case for glasses, do not wear contacts or bring a case, the hospital does not have contact cases, dentures or partials will have to be removed , make sure they are clean, we will provide a denture cup to put them in. You will need some one to drive you home and a responsible person over the age of 46 to stay with you for the first 24 hours after surgery.  ?

## 2021-12-07 ENCOUNTER — Ambulatory Visit (HOSPITAL_COMMUNITY): Payer: BC Managed Care – PPO | Admitting: Anesthesiology

## 2021-12-07 ENCOUNTER — Encounter (HOSPITAL_COMMUNITY)
Admission: RE | Disposition: A | Discharge status: Home / Self Care | Payer: Self-pay | Source: Home / Self Care | Attending: Orthopedic Surgery

## 2021-12-07 ENCOUNTER — Ambulatory Visit (HOSPITAL_COMMUNITY)
Admission: RE | Admit: 2021-12-07 | Discharge: 2021-12-07 | Disposition: A | Discharge status: Home / Self Care | Payer: BC Managed Care – PPO | Attending: Orthopedic Surgery | Admitting: Orthopedic Surgery

## 2021-12-07 ENCOUNTER — Other Ambulatory Visit: Payer: Self-pay

## 2021-12-07 ENCOUNTER — Ambulatory Visit (HOSPITAL_COMMUNITY): Payer: BC Managed Care – PPO

## 2021-12-07 ENCOUNTER — Encounter (HOSPITAL_COMMUNITY): Payer: Self-pay | Admitting: Orthopedic Surgery

## 2021-12-07 DIAGNOSIS — X58XXXA Exposure to other specified factors, initial encounter: Secondary | ICD-10-CM | POA: Insufficient documentation

## 2021-12-07 DIAGNOSIS — S93692S Other sprain of left foot, sequela: Secondary | ICD-10-CM | POA: Diagnosis present

## 2021-12-07 DIAGNOSIS — Z87891 Personal history of nicotine dependence: Secondary | ICD-10-CM | POA: Insufficient documentation

## 2021-12-07 DIAGNOSIS — X58XXXS Exposure to other specified factors, sequela: Secondary | ICD-10-CM | POA: Diagnosis not present

## 2021-12-07 DIAGNOSIS — J45909 Unspecified asthma, uncomplicated: Secondary | ICD-10-CM | POA: Insufficient documentation

## 2021-12-07 DIAGNOSIS — S93325S Dislocation of tarsometatarsal joint of left foot, sequela: Secondary | ICD-10-CM

## 2021-12-07 DIAGNOSIS — S93325A Dislocation of tarsometatarsal joint of left foot, initial encounter: Secondary | ICD-10-CM

## 2021-12-07 DIAGNOSIS — S93622A Sprain of tarsometatarsal ligament of left foot, initial encounter: Secondary | ICD-10-CM | POA: Diagnosis not present

## 2021-12-07 DIAGNOSIS — M25375 Other instability, left foot: Secondary | ICD-10-CM | POA: Diagnosis not present

## 2021-12-07 DIAGNOSIS — F419 Anxiety disorder, unspecified: Secondary | ICD-10-CM | POA: Diagnosis not present

## 2021-12-07 HISTORY — DX: Unspecified asthma, uncomplicated: J45.909

## 2021-12-07 HISTORY — DX: Anxiety disorder, unspecified: F41.9

## 2021-12-07 HISTORY — DX: Attention-deficit hyperactivity disorder, unspecified type: F90.9

## 2021-12-07 HISTORY — PX: ANKLE FUSION: SHX5718

## 2021-12-07 HISTORY — DX: Other seasonal allergic rhinitis: J30.2

## 2021-12-07 LAB — CBC
HCT: 41.1 % (ref 36.0–46.0)
Hemoglobin: 13.5 g/dL (ref 12.0–15.0)
MCH: 27.4 pg (ref 26.0–34.0)
MCHC: 32.8 g/dL (ref 30.0–36.0)
MCV: 83.5 fL (ref 80.0–100.0)
Platelets: 216 10*3/uL (ref 150–400)
RBC: 4.92 MIL/uL (ref 3.87–5.11)
RDW: 12.2 % (ref 11.5–15.5)
WBC: 3.5 10*3/uL — ABNORMAL LOW (ref 4.0–10.5)
nRBC: 0 % (ref 0.0–0.2)

## 2021-12-07 LAB — POCT PREGNANCY, URINE: Preg Test, Ur: NEGATIVE

## 2021-12-07 SURGERY — ANKLE FUSION
Anesthesia: Monitor Anesthesia Care | Site: Ankle | Laterality: Left

## 2021-12-07 MED ORDER — LACTATED RINGERS IV SOLN: INTRAVENOUS | Status: DC

## 2021-12-07 MED ORDER — LIDOCAINE HCL (CARDIAC) PF 100 MG/5ML IV SOSY
PREFILLED_SYRINGE | INTRAVENOUS | Status: DC | PRN
  Administered 2021-12-07: 40 mg via INTRAVENOUS

## 2021-12-07 MED ORDER — MIDAZOLAM HCL 2 MG/2ML IJ SOLN: 2.0000 mg | Freq: Once | INTRAMUSCULAR | Status: AC

## 2021-12-07 MED ORDER — CEFAZOLIN SODIUM-DEXTROSE 2-4 GM/100ML-% IV SOLN
2.0000 g | INTRAVENOUS | Status: AC
  Administered 2021-12-07: 2 g via INTRAVENOUS
  Filled 2021-12-07: qty 100

## 2021-12-07 MED ORDER — OXYCODONE-ACETAMINOPHEN 5-325 MG PO TABS
1.0000 | ORAL_TABLET | ORAL | 0 refills | Status: DC | PRN
Start: 2021-12-07 — End: 2021-12-28

## 2021-12-07 MED ORDER — FENTANYL CITRATE (PF) 100 MCG/2ML IJ SOLN: 100.0000 ug | Freq: Once | INTRAMUSCULAR | Status: AC

## 2021-12-07 MED ORDER — ACETAMINOPHEN 500 MG PO TABS
1000.0000 mg | ORAL_TABLET | Freq: Once | ORAL | Status: AC
  Administered 2021-12-07: 1000 mg via ORAL
  Filled 2021-12-07: qty 2

## 2021-12-07 MED ORDER — ONDANSETRON HCL 4 MG/2ML IJ SOLN
INTRAMUSCULAR | Status: DC | PRN
  Administered 2021-12-07: 4 mg via INTRAVENOUS

## 2021-12-07 MED ORDER — MIDAZOLAM HCL 2 MG/2ML IJ SOLN
INTRAMUSCULAR | Status: DC | PRN
  Administered 2021-12-07: 1 mg via INTRAVENOUS

## 2021-12-07 MED ORDER — ROPIVACAINE HCL 5 MG/ML IJ SOLN
INTRAMUSCULAR | Status: DC | PRN
  Administered 2021-12-07: 25 mL via PERINEURAL
  Administered 2021-12-07: 15 mL via PERINEURAL

## 2021-12-07 MED ORDER — FENTANYL CITRATE (PF) 250 MCG/5ML IJ SOLN
INTRAMUSCULAR | Status: DC | PRN
  Administered 2021-12-07: 50 ug via INTRAVENOUS

## 2021-12-07 MED ORDER — OXYCODONE HCL 5 MG/5ML PO SOLN: 5.0000 mg | Freq: Once | ORAL | Status: DC | PRN

## 2021-12-07 MED ORDER — CHLORHEXIDINE GLUCONATE 0.12 % MT SOLN
15.0000 mL | Freq: Once | OROMUCOSAL | Status: AC
  Administered 2021-12-07: 15 mL via OROMUCOSAL
  Filled 2021-12-07: qty 15

## 2021-12-07 MED ORDER — ONDANSETRON HCL 4 MG/2ML IJ SOLN: 4.0000 mg | Freq: Once | INTRAMUSCULAR | Status: DC | PRN

## 2021-12-07 MED ORDER — ORAL CARE MOUTH RINSE: 15.0000 mL | Freq: Once | OROMUCOSAL | Status: AC

## 2021-12-07 MED ORDER — FENTANYL CITRATE (PF) 100 MCG/2ML IJ SOLN
INTRAMUSCULAR | Status: AC
  Administered 2021-12-07: 100 ug via INTRAVENOUS
  Filled 2021-12-07: qty 2

## 2021-12-07 MED ORDER — MIDAZOLAM HCL 2 MG/2ML IJ SOLN
INTRAMUSCULAR | Status: AC
  Filled 2021-12-07: qty 2

## 2021-12-07 MED ORDER — DEXAMETHASONE SODIUM PHOSPHATE 10 MG/ML IJ SOLN
INTRAMUSCULAR | Status: DC | PRN
  Administered 2021-12-07: 5 mg

## 2021-12-07 MED ORDER — FENTANYL CITRATE (PF) 250 MCG/5ML IJ SOLN
INTRAMUSCULAR | Status: AC
  Filled 2021-12-07: qty 5

## 2021-12-07 MED ORDER — FENTANYL CITRATE (PF) 100 MCG/2ML IJ SOLN: 25.0000 ug | INTRAMUSCULAR | Status: DC | PRN

## 2021-12-07 MED ORDER — MIDAZOLAM HCL 2 MG/2ML IJ SOLN
INTRAMUSCULAR | Status: AC
  Administered 2021-12-07: 2 mg via INTRAVENOUS
  Filled 2021-12-07: qty 2

## 2021-12-07 MED ORDER — AMISULPRIDE (ANTIEMETIC) 5 MG/2ML IV SOLN: 10.0000 mg | Freq: Once | INTRAVENOUS | Status: DC | PRN

## 2021-12-07 MED ORDER — OXYCODONE HCL 5 MG PO TABS: 5.0000 mg | ORAL_TABLET | Freq: Once | ORAL | Status: DC | PRN

## 2021-12-07 MED ORDER — PROPOFOL 500 MG/50ML IV EMUL
INTRAVENOUS | Status: DC | PRN
  Administered 2021-12-07: 80 ug/kg/min via INTRAVENOUS

## 2021-12-07 MED ORDER — 0.9 % SODIUM CHLORIDE (POUR BTL) OPTIME
TOPICAL | Status: DC | PRN
  Administered 2021-12-07: 1000 mL

## 2021-12-07 MED ORDER — PROPOFOL 10 MG/ML IV BOLUS
INTRAVENOUS | Status: DC | PRN
  Administered 2021-12-07: 100 mg via INTRAVENOUS

## 2021-12-07 SURGICAL SUPPLY — 42 items
BAG COUNTER SPONGE SURGICOUNT (BAG) ×2 IMPLANT
BANDAGE ESMARK 6X9 LF (GAUZE/BANDAGES/DRESSINGS) ×1 IMPLANT
BIT DRILL 3.0 6IN (DRILL) IMPLANT
BLADE SAW SGTL 81X20 HD (BLADE) ×1 IMPLANT
BNDG COHESIVE 4X5 TAN STRL (GAUZE/BANDAGES/DRESSINGS) ×2 IMPLANT
BNDG COHESIVE 6X5 TAN NS LF (GAUZE/BANDAGES/DRESSINGS) ×1 IMPLANT
BNDG ESMARK 6X9 LF (GAUZE/BANDAGES/DRESSINGS) ×2
BNDG GAUZE ELAST 4 BULKY (GAUZE/BANDAGES/DRESSINGS) ×3 IMPLANT
COVER SURGICAL LIGHT HANDLE (MISCELLANEOUS) ×3 IMPLANT
DRAPE OEC MINIVIEW 54X84 (DRAPES) ×1 IMPLANT
DRAPE U-SHAPE 47X51 STRL (DRAPES) ×2 IMPLANT
DRILL 3.0 6IN (DRILL) ×2
DRSG ADAPTIC 3X8 NADH LF (GAUZE/BANDAGES/DRESSINGS) ×2 IMPLANT
DRSG PAD ABDOMINAL 8X10 ST (GAUZE/BANDAGES/DRESSINGS) ×1 IMPLANT
DURAPREP 26ML APPLICATOR (WOUND CARE) ×2 IMPLANT
ELECT REM PT RETURN 9FT ADLT (ELECTROSURGICAL) ×2
ELECTRODE REM PT RTRN 9FT ADLT (ELECTROSURGICAL) ×1 IMPLANT
GAUZE SPONGE 4X4 12PLY STRL (GAUZE/BANDAGES/DRESSINGS) ×2 IMPLANT
GLOVE BIOGEL PI IND STRL 9 (GLOVE) ×1 IMPLANT
GLOVE BIOGEL PI INDICATOR 9 (GLOVE) ×1
GLOVE SURG ORTHO 9.0 STRL STRW (GLOVE) ×3 IMPLANT
GOWN STRL REUS W/ TWL LRG LVL3 (GOWN DISPOSABLE) ×1 IMPLANT
GOWN STRL REUS W/ TWL XL LVL3 (GOWN DISPOSABLE) ×1 IMPLANT
GOWN STRL REUS W/TWL LRG LVL3 (GOWN DISPOSABLE) ×4
GOWN STRL REUS W/TWL XL LVL3 (GOWN DISPOSABLE) ×2
GUIDEWIRE 1.6 6IN (WIRE) ×2 IMPLANT
KIT BASIN OR (CUSTOM PROCEDURE TRAY) ×2 IMPLANT
KIT TURNOVER KIT B (KITS) ×2 IMPLANT
NS IRRIG 1000ML POUR BTL (IV SOLUTION) ×2 IMPLANT
PACK ORTHO EXTREMITY (CUSTOM PROCEDURE TRAY) ×2 IMPLANT
PAD ARMBOARD 7.5X6 YLW CONV (MISCELLANEOUS) ×4 IMPLANT
PUTTY DBM STAGRAFT PLUS 2CC (Putty) ×1 IMPLANT
SCREW CANN SHT HDLS 4X38 (Screw) ×1 IMPLANT
SCREW CANN SHT HDLS 4X40 (Screw) ×1 IMPLANT
SPONGE T-LAP 18X18 ~~LOC~~+RFID (SPONGE) ×2 IMPLANT
SUCTION FRAZIER HANDLE 10FR (MISCELLANEOUS) ×2
SUCTION TUBE FRAZIER 10FR DISP (MISCELLANEOUS) ×1 IMPLANT
SUT ETHILON 2 0 PSLX (SUTURE) ×5 IMPLANT
TOWEL GREEN STERILE (TOWEL DISPOSABLE) ×2 IMPLANT
TOWEL GREEN STERILE FF (TOWEL DISPOSABLE) ×2 IMPLANT
TUBE CONNECTING 12X1/4 (SUCTIONS) ×2 IMPLANT
WATER STERILE IRR 1000ML POUR (IV SOLUTION) ×1 IMPLANT

## 2021-12-07 NOTE — Transfer of Care (Signed)
Immediate Anesthesia Transfer of Care Note ? ?Patient: Brittney Alvarez ? ?Procedure(s) Performed: LEFT LISFRANC FUSION (Left: Ankle) ? ?Patient Location: PACU ? ?Anesthesia Type:MAC and Regional ? ?Level of Consciousness: awake, drowsy, patient cooperative and responds to stimulation ? ?Airway & Oxygen Therapy: Patient Spontanous Breathing and Patient connected to nasal cannula oxygen ? ?Post-op Assessment: Report given to RN and Post -op Vital signs reviewed and stable ? ?Post vital signs: Reviewed and stable ? ?Last Vitals:  ?Vitals Value Taken Time  ?BP 123/67 12/07/21 1225  ?Temp    ?Pulse 72 12/07/21 1226  ?Resp 16 12/07/21 1226  ?SpO2 99 % 12/07/21 1226  ?Vitals shown include unvalidated device data. ? ?Last Pain:  ?Vitals:  ? 12/07/21 1030  ?TempSrc:   ?PainSc: 0-No pain  ?   ? ?Patients Stated Pain Goal: 0 (12/07/21 1030) ? ?Complications: No notable events documented. ?

## 2021-12-07 NOTE — Anesthesia Procedure Notes (Signed)
Anesthesia Regional Block: Popliteal block  ? ?Pre-Anesthetic Checklist: , timeout performed,  Correct Patient, Correct Site, Correct Laterality,  Correct Procedure, Correct Position, site marked,  Risks and benefits discussed,  Surgical consent,  Pre-op evaluation,  At surgeon's request and post-op pain management ? ?Laterality: Left ? ?Prep: chloraprep     ?  ?Needles:  ?Injection technique: Single-shot ? ?Needle Type: Echogenic Stimulator Needle   ? ? ?Needle Length: 10cm  ?Needle Gauge: 20  ? ? ? ?Additional Needles: ? ? ?Procedures:,,,, ultrasound used (permanent image in chart),,    ?Narrative:  ?Start time: 12/07/2021 10:11 AM ?End time: 12/07/2021 10:15 AM ? ?Performed by: Personally  ?Anesthesiologist: Lidia Collum, MD ? ?Additional Notes: ?Standard monitors applied. Skin prepped. Good needle visualization with ultrasound. Injection made in 5cc increments with no resistance to injection. Patient tolerated the procedure well. ? ? ? ? ?

## 2021-12-07 NOTE — Anesthesia Postprocedure Evaluation (Signed)
Anesthesia Post Note ? ?Patient: Brittney Alvarez ? ?Procedure(s) Performed: LEFT LISFRANC FUSION (Left: Ankle) ? ?  ? ?Patient location during evaluation: PACU ?Anesthesia Type: Regional ?Level of consciousness: awake and alert ?Pain management: pain level controlled ?Vital Signs Assessment: post-procedure vital signs reviewed and stable ?Respiratory status: spontaneous breathing, nonlabored ventilation and respiratory function stable ?Cardiovascular status: blood pressure returned to baseline and stable ?Postop Assessment: no apparent nausea or vomiting ?Anesthetic complications: no ? ? ?No notable events documented. ? ?Last Vitals:  ?Vitals:  ? 12/07/21 1240 12/07/21 1245  ?BP: 118/68   ?Pulse: 68 72  ?Resp: 19 13  ?Temp:  (!) 36.4 ?C  ?SpO2: 100% 100%  ?  ?Last Pain:  ?Vitals:  ? 12/07/21 1245  ?TempSrc:   ?PainSc: 0-No pain  ? ? ?  ?  ?  ?  ?  ?  ? ?Lidia Collum ? ? ? ? ?

## 2021-12-07 NOTE — Anesthesia Procedure Notes (Signed)
Anesthesia Regional Block: Adductor canal block  ? ?Pre-Anesthetic Checklist: , timeout performed,  Correct Patient, Correct Site, Correct Laterality,  Correct Procedure, Correct Position, site marked,  Risks and benefits discussed,  Surgical consent,  Pre-op evaluation,  At surgeon's request and post-op pain management ? ?Laterality: Left ? ?Prep: chloraprep     ?  ?Needles:  ?Injection technique: Single-shot ? ?Needle Type: Echogenic Stimulator Needle   ? ? ?Needle Length: 10cm  ?Needle Gauge: 20  ? ? ? ?Additional Needles: ? ? ?Procedures:,,,, ultrasound used (permanent image in chart),,    ?Narrative:  ?Start time: 12/07/2021 10:07 AM ?End time: 12/07/2021 10:11 AM ?Injection made incrementally with aspirations every 5 mL. ? ?Performed by: Personally  ?Anesthesiologist: Lidia Collum, MD ? ?Additional Notes: ?Standard monitors applied. Skin prepped. Good needle visualization with ultrasound. Injection made in 5cc increments with no resistance to injection. Patient tolerated the procedure well. ? ? ? ? ?

## 2021-12-07 NOTE — Anesthesia Preprocedure Evaluation (Signed)
Anesthesia Evaluation  ?Patient identified by MRN, date of birth, ID band ?Patient awake ? ? ? ?Reviewed: ?Allergy & Precautions, NPO status , Patient's Chart, lab work & pertinent test results ? ?History of Anesthesia Complications ?Negative for: history of anesthetic complications ? ?Airway ?Mallampati: II ? ?TM Distance: >3 FB ?Neck ROM: Full ? ? ? Dental ?  ?Pulmonary ?asthma , former smoker,  ?  ?Pulmonary exam normal ? ? ? ? ? ? ? Cardiovascular ?negative cardio ROS ?Normal cardiovascular exam ? ? ?  ?Neuro/Psych ?Anxiety negative neurological ROS ?   ? GI/Hepatic ?negative GI ROS, Neg liver ROS,   ?Endo/Other  ?negative endocrine ROS ? Renal/GU ?negative Renal ROS  ?negative genitourinary ?  ?Musculoskeletal ?negative musculoskeletal ROS ?(+)  ? Abdominal ?  ?Peds ? Hematology ?negative hematology ROS ?(+)   ?Anesthesia Other Findings ? ? Reproductive/Obstetrics ? ?  ? ? ? ? ? ? ? ? ? ? ? ? ? ?  ?  ? ? ? ? ? ? ? ? ?Anesthesia Physical ?Anesthesia Plan ? ?ASA: 2 ? ?Anesthesia Plan: MAC and Regional  ? ?Post-op Pain Management: Regional block* and Tylenol PO (pre-op)*  ? ?Induction: Intravenous ? ?PONV Risk Score and Plan: 2 and Propofol infusion, TIVA and Treatment may vary due to age or medical condition ? ?Airway Management Planned: Natural Airway, Nasal Cannula and Simple Face Mask ? ?Additional Equipment: None ? ?Intra-op Plan:  ? ?Post-operative Plan:  ? ?Informed Consent: I have reviewed the patients History and Physical, chart, labs and discussed the procedure including the risks, benefits and alternatives for the proposed anesthesia with the patient or authorized representative who has indicated his/her understanding and acceptance.  ? ? ? ? ? ?Plan Discussed with:  ? ?Anesthesia Plan Comments:   ? ? ? ? ? ? ?Anesthesia Quick Evaluation ? ?

## 2021-12-07 NOTE — H&P (Signed)
Brittney Alvarez is an 51 y.o. female.   ?Chief Complaint: Painful left Lisfranc injury ?HPI: Patient is a 51 year old woman who presents 3-1/2 months status post Lisfranc injury to her left foot she has been immobilized in a fracture boot.  She states she still has pain with activities of daily living despite conservative therapy. ? ?Past Medical History:  ?Diagnosis Date  ? ADHD (attention deficit hyperactivity disorder)   ? Anxiety   ? Asthma   ? years ago  ? Seasonal allergies   ? ? ?Past Surgical History:  ?Procedure Laterality Date  ? FOOT SURGERY Left   ? bone spurs  ? ? ?No family history on file. ?Social History:  reports that she quit smoking about 5 years ago. Her smoking use included cigarettes. She has never used smokeless tobacco. She reports that she does not currently use alcohol. She reports that she does not use drugs. ? ?Allergies:  ?Allergies  ?Allergen Reactions  ? Other   ?  Had surgery on 04/27/19 - pt stated, "had a reaction to sutures"  ? ? ?No medications prior to admission.  ? ? ?No results found for this or any previous visit (from the past 48 hour(s)). ?No results found. ? ?Review of Systems  ?All other systems reviewed and are negative. ? ?Height '5\' 11"'$  (1.803 m), weight 86.2 kg, last menstrual period 12/01/2021. ?Physical Exam  ?Patient is alert, oriented, no adenopathy, well-dressed, normal affect, normal respiratory effort. ?Examination patient has a good dorsalis pedis pulse.  Distraction across the base of the first and second metatarsal reproduces pain.  Radiographs have not shown any displacement however patient's pain is centered over the Lisfranc complex and pain is reproduced with stressing the Lisfranc complex. ?Assessment/Plan ?1. Lisfranc's sprain, left, initial encounter   ?  ?  ?Plan: Patient wishes to proceed with surgical intervention.  We will plan for fusion across the Lisfranc complex.  Plan for outpatient surgery.  Risks and benefits were discussed including the size of  the incision, surgical risks, need for nonweightbearing.  Patient states she understands wished to proceed at this time. ? ?Newt Minion, MD ?12/07/2021, 6:43 AM ? ? ? ?

## 2021-12-07 NOTE — Op Note (Signed)
12/07/2021 ? ?12:22 PM ? ?PATIENT:  Brittney Alvarez   ? ?PRE-OPERATIVE DIAGNOSIS:  LISFRANC INSTABILITY LEFT FOOT ? ?POST-OPERATIVE DIAGNOSIS:  Same ? ?PROCEDURE:  LEFT LISFRANC FUSION ?Application 2 cc stay graft. ?C-arm fluoroscopy to verify reduction. ? ?SURGEON:  Newt Minion, MD ? ?PHYSICIAN ASSISTANT:None ?ANESTHESIA:   General ? ?PREOPERATIVE INDICATIONS:  [name] is a  51 y.o. female with a diagnosis of [place] who failed conservative measures and elected for surgical management.   ? ?The risks benefits and alternatives were discussed with the patient preoperatively including but not limited to the risks of infection, bleeding, nerve injury, cardiopulmonary complications, the need for revision surgery, among others, and the patient was willing to proceed. ? ?OPERATIVE IMPLANTS: Stay graft 2 cc. ?4.0 headless cannulated screws x2. ? ?'@ENCIMAGES'$ @ ? ?OPERATIVE FINDINGS: C-arm fluoroscopy verified reduction of the base of the first metatarsal medial cuneiform and reduction of the screw across the Lisfranc complex. ? ?OPERATIVE PROCEDURE: Patient was brought the operating room after undergoing a regional anesthetic.  After adequate levels anesthesia were obtained patient's left lower extremity was prepped using DuraPrep draped into a sterile field a timeout was called.  A dorsal incision was made over the first webspace.  Blunt dissection was carried down to the EHL tendon that was very thin and atrophic.  This was protected and the incision was carried down between the anterior tibial tendon and the EHL tendon.  The base of the first metatarsal medial cuneiform capsule was lax and unstable.  Subperiosteal dissection was used to debride the base of the first metatarsal joint retractors were placed and an oscillating saw was used to remove articular cartilage from the base of the first metatarsal medial cuneiform.  This was irrigated debrided with a curette rondure reduced.  Guidewire was  inserted from the base of the first metatarsal into the medial cuneiform.  C-arm possibly verified alignment and 48 mm screw was used to stabilize the joint.  A separate guidewire was then used from the medial cuneiform through the base of the second metatarsal C-arm possibly verified alignment and this was secured with a 38 mm headless cannulated screw.  C-arm fluoroscopy verified reduction.  Stay graft was placed in the joint of the base of the first metatarsal medial cuneiform prior to compression.  The wound was irrigated with normal saline the incision closed using 2-0 nylon.  Sterile dressing was applied patient was taken the PACU in stable condition. ? ? ?DISCHARGE PLANNING: ? ?Antibiotic duration: Preoperative antibiotics ? ?Weightbearing: Nonweightbearing on the left ? ?Pain medication: Prescription for Percocet ? ?Dressing care/ Wound VAC: Dry dressing change in the office in follow-up ? ?Ambulatory devices: Walker or crutches ? ?Discharge to: Home. ? ?Follow-up: In the office 1 week post operative. ? ? ? ? ? ? ?  ?

## 2021-12-10 ENCOUNTER — Encounter (HOSPITAL_COMMUNITY): Payer: Self-pay | Admitting: Orthopedic Surgery

## 2021-12-13 ENCOUNTER — Other Ambulatory Visit: Payer: Self-pay | Admitting: Surgical

## 2021-12-13 ENCOUNTER — Ambulatory Visit: Payer: BC Managed Care – PPO

## 2021-12-13 DIAGNOSIS — S93325S Dislocation of tarsometatarsal joint of left foot, sequela: Secondary | ICD-10-CM

## 2021-12-13 MED ORDER — IBUPROFEN 800 MG PO TABS: 800.0000 mg | ORAL_TABLET | Freq: Three times a day (TID) | ORAL | 2 refills | Status: DC | PRN

## 2021-12-13 NOTE — Progress Notes (Signed)
Patient is s/p a left Lisfranc fusion on 12/07/2021. She is here today for a 1 week post op dressing change. Pt is non weightbearing in a wheelchair and uses a knee scooter at home. Dressing removed and the incision is well approximated with minimal swelling and stitches are intact. She does voice some concerns about her pain medication causing her to be nauseated and states that she does not handel pain medication well and requests a prescription for 800 mg Ibuprofen. This request was sent to [place] to address. Applied 4x4 gauze and ace bandage to the left foot. Some additional supplies were given. Pt advised that she may shower and to apply this dry dressing daily and to continue her non weight bearing status. Patient will follow up in the office in one week with Dr. Sharol Given. This appt was scheduled for her and advised to call with any questions or concerns.  ? ?Brittney Alvarez, [place], CWCA ?

## 2021-12-20 ENCOUNTER — Ambulatory Visit (INDEPENDENT_AMBULATORY_CARE_PROVIDER_SITE_OTHER): Payer: BC Managed Care – PPO | Admitting: Orthopedic Surgery

## 2021-12-20 ENCOUNTER — Ambulatory Visit (INDEPENDENT_AMBULATORY_CARE_PROVIDER_SITE_OTHER): Payer: BC Managed Care – PPO

## 2021-12-20 DIAGNOSIS — S93325S Dislocation of tarsometatarsal joint of left foot, sequela: Secondary | ICD-10-CM | POA: Diagnosis not present

## 2021-12-23 ENCOUNTER — Encounter: Payer: Self-pay | Admitting: Orthopedic Surgery

## 2021-12-28 ENCOUNTER — Telehealth: Payer: Self-pay

## 2021-12-28 MED ORDER — OXYCODONE-ACETAMINOPHEN 5-325 MG PO TABS: 1.0000 | ORAL_TABLET | Freq: Four times a day (QID) | ORAL | 0 refills | Status: DC | PRN

## 2021-12-28 NOTE — Addendum Note (Signed)
Addended by: Dondra Prader R on: 12/28/2021 11:02 AM ? ? Modules accepted: Orders ? ?

## 2021-12-28 NOTE — Telephone Encounter (Signed)
12/07/21 Lisfranc fusion request refill Oxycodone please send to pharm.  ?

## 2022-01-03 ENCOUNTER — Encounter: Payer: Self-pay | Admitting: Orthopedic Surgery

## 2022-01-03 ENCOUNTER — Ambulatory Visit (INDEPENDENT_AMBULATORY_CARE_PROVIDER_SITE_OTHER): Payer: BC Managed Care – PPO | Admitting: Orthopedic Surgery

## 2022-01-03 DIAGNOSIS — S93325S Dislocation of tarsometatarsal joint of left foot, sequela: Secondary | ICD-10-CM

## 2022-01-03 NOTE — Progress Notes (Signed)
Office Visit Note   Patient: Brittney Alvarez           Date of Birth: 18-Jan-1971           MRN: [id number] Visit Date: 01/03/2022              Requested by: Angelina Sheriff, MD [address] [place],  [place] [address] PCP: Angelina Sheriff, MD  Chief Complaint  Patient presents with   Left Foot - Routine Post Op    12/07/2021 left Lisfranc fusion       HPI: Patient is a 51 year old woman who is 4 weeks status post Lisfranc fusion left foot.  She is in a kneeling scooter Ace wrap she states she does feel swelling feels like her foot is sunburn.  Assessment & Plan: Visit Diagnoses: No diagnosis found.  Plan: Order placed for physical therapy to start range of motion strengthening weightbearing as tolerated left ankle left foot.  She will start weightbearing as tolerated in either her fracture boot or postoperative shoe with her orthotic.  Recommended wearing the compression sock daily.  Patient is also given instructions on Achilles stretching and toes strengthening.  Follow-Up Instructions: Return in about 2 weeks (around 01/17/2022).   Ortho Exam  Patient is alert, oriented, no adenopathy, well-dressed, normal affect, normal respiratory effort. Examination patient's incision is well-healed she has some swelling with a mild petechial rash most likely secondary to swelling.  She does have some Achilles tightness.  No redness no cellulitis no signs of infection no wound dehiscence.  Imaging: No results found. No images are attached to the encounter.  Labs: No results found for: HGBA1C, ESRSEDRATE, CRP, LABURIC, REPTSTATUS, GRAMSTAIN, CULT, LABORGA   No results found for: ALBUMIN, PREALBUMIN, CBC  No results found for: MG No results found for: VD25OH  No results found for: PREALBUMIN    Latest Ref Rng & Units 12/07/2021    8:53 AM  CBC EXTENDED  WBC 4.0 - 10.5 K/uL 3.5    RBC 3.87 - 5.11 MIL/uL 4.92    Hemoglobin 12.0 - 15.0 g/dL 13.5    HCT 36.0 - 46.0 % 41.1     Platelets 150 - 400 K/uL 216       There is no height or weight on file to calculate BMI.  Orders:  No orders of the defined types were placed in this encounter.  No orders of the defined types were placed in this encounter.    Procedures: No procedures performed  Clinical Data: No additional findings.  ROS:  All other systems negative, except as noted in the HPI. Review of Systems  Objective: Vital Signs: There were no vitals taken for this visit.  Specialty Comments:  No specialty comments available.  PMFS History: Patient Active Problem List   Diagnosis Date Noted   Lisfranc dislocation, left, sequela    ADD (attention deficit disorder) 04/09/2019   Menorrhagia with regular cycle 04/09/2019   Lesion of radial nerve 12/15/2018   Occipital neuralgia of left side 01/30/2018   Right lateral epicondylitis 11/19/2017   Past Medical History:  Diagnosis Date   ADHD (attention deficit hyperactivity disorder)    Anxiety    Asthma    years ago   Seasonal allergies     History reviewed. No pertinent family history.  Past Surgical History:  Procedure Laterality Date   ANKLE FUSION Left 12/07/2021   Procedure: LEFT LISFRANC FUSION;  Surgeon: Newt Minion, MD;  Location: [facility];  Service: Orthopedics;  Laterality: Left;   FOOT SURGERY Left    bone spurs   Social History   Occupational History   Not on file  Tobacco Use   Smoking status: Former    Types: Cigarettes    Quit date: 10/2016    Years since quitting: 5.2   Smokeless tobacco: Never  Vaping Use   Vaping Use: Never used  Substance and Sexual Activity   Alcohol use: Not Currently   Drug use: Never   Sexual activity: Not on file

## 2022-01-10 ENCOUNTER — Encounter: Payer: Self-pay | Admitting: Rehabilitative and Restorative Service Providers"

## 2022-01-10 ENCOUNTER — Ambulatory Visit (INDEPENDENT_AMBULATORY_CARE_PROVIDER_SITE_OTHER): Payer: BC Managed Care – PPO | Admitting: Rehabilitative and Restorative Service Providers"

## 2022-01-10 DIAGNOSIS — M6281 Muscle weakness (generalized): Secondary | ICD-10-CM

## 2022-01-10 DIAGNOSIS — M25672 Stiffness of left ankle, not elsewhere classified: Secondary | ICD-10-CM

## 2022-01-10 DIAGNOSIS — R262 Difficulty in walking, not elsewhere classified: Secondary | ICD-10-CM

## 2022-01-10 DIAGNOSIS — R600 Localized edema: Secondary | ICD-10-CM

## 2022-01-10 DIAGNOSIS — M25572 Pain in left ankle and joints of left foot: Secondary | ICD-10-CM

## 2022-01-10 NOTE — Therapy (Signed)
OUTPATIENT PHYSICAL THERAPY LOWER EXTREMITY EVALUATION   Patient Name: Marinda Tyer MRN: [id number] DOB:[birth year], 51 y.o., female Today's Date: 01/10/2022   PT End of Session - 01/10/22 1412     Visit Number 1    Number of Visits 18    Progress Note Due on Visit 10    PT Start Time 0933    PT Stop Time 1015    PT Time Calculation (min) 42 min    Activity Tolerance Patient tolerated treatment well;No increased pain    Behavior During Therapy WFL for tasks assessed/performed             Past Medical History:  Diagnosis Date   ADHD (attention deficit hyperactivity disorder)    Anxiety    Asthma    years ago   Seasonal allergies    Past Surgical History:  Procedure Laterality Date   ANKLE FUSION Left 12/07/2021   Procedure: LEFT LISFRANC FUSION;  Surgeon: Newt Minion, MD;  Location: [facility];  Service: Orthopedics;  Laterality: Left;   FOOT SURGERY Left    bone spurs   Patient Active Problem List   Diagnosis Date Noted   Lisfranc dislocation, left, sequela    ADD (attention deficit disorder) 04/09/2019   Menorrhagia with regular cycle 04/09/2019   Lesion of radial nerve 12/15/2018   Occipital neuralgia of left side 01/30/2018   Right lateral epicondylitis 11/19/2017    PCP: Jacqlyn Larsen, MD  REFERRING PROVIDER: Newt Minion, MD  REFERRING DIAG: [phone] (ICD-10-CM) - Lisfranc dislocation, left, sequela   THERAPY DIAG:  Difficulty in walking, not elsewhere classified  Muscle weakness (generalized)  Localized edema  Stiffness of left ankle, not elsewhere classified  Pain in left ankle and joints of left foot  Rationale for Evaluation and Treatment Rehabilitation  ONSET DATE: 12/07/2021 surgery  SUBJECTIVE:   SUBJECTIVE STATEMENT: Missed the top of 5 steps and fractured/dislocated her foot late December 2022.  Wore a boot until 12/04/2021.  Surgery 12/07/2021.  PERTINENT HISTORY: ADD, asthma  PAIN:  Are you having pain? Yes: NPRS scale:  4-7/10 Pain location: L foot Pain description: Tender to the touch, shooting pains, swollen Aggravating factors: WB or dependent position Relieving factors: Elevation  PRECAUTIONS: None  WEIGHT BEARING RESTRICTIONS No  FALLS:  Has patient fallen in last 6 months? Yes. Number of falls 1  LIVING ENVIRONMENT: Lives with: lives with their family Lives in: House/apartment Stairs: Yes: Internal: 5 steps; on right going up and on left going up and External: 3-5 steps; none to get in house Has following equipment at home:  scooter  OCCUPATION: Accountant, at a computer most of the day  PLOF: [place] Return to camping, walking the dogs, swimming, get out and about on rough terrain, clean the house   OBJECTIVE:   DIAGNOSTIC FINDINGS: Healing post-surgery (see imaging)  PATIENT SURVEYS:  FOTO 26 (Goal 59 in 18 visits)  COGNITION:  Overall cognitive status: Within functional limits for tasks assessed     SENSATION: Denies tingling, some paresthesias around the scar  EDEMA:  Present and not objectively assessed  LOWER EXTREMITY ROM:  Active ROM Right eval Left eval  Hip flexion    Hip extension    Hip abduction    Hip adduction    Hip internal rotation    Hip external rotation    Knee flexion    Knee extension    Ankle dorsiflexion    Ankle plantarflexion 1 -7  Ankle inversion  Ankle eversion     (Blank rows = not tested)  LOWER EXTREMITY MMT:  MMT Deferred due to time post-surgery Right eval Left eval  Hip flexion    Hip extension    Hip abduction    Hip adduction    Hip internal rotation    Hip external rotation    Knee flexion    Knee extension    Ankle dorsiflexion    Ankle plantarflexion    Ankle inversion    Ankle eversion     (Blank rows = not tested)  GAIT:  Comments: Maybell is using a scooter but reports taking a few steps WBAT at home and in her office.  I encouraged her to bring a shoe so we can look at gait and do  more WB activities on her next PT visit.    TODAY'S TREATMENT: 01/10/2022  Exercises - Supine Quadricep Sets  - 10 reps - 5 second hold - Sidelying Hip Abduction  - 10 reps - 5 seconds hold - Prone Hip Extension  - 10 reps - 3 seconds hold - Isometric Ankle Inversion at Wall  - 20 reps - 5 hold - Isometric Ankle Eversion at Wall  - 20 reps - 5 hold - Seated Heel Toe Raises  - 3 sets - 10 reps   PATIENT EDUCATION:  Education details: See above Person educated: Patient Education method: Explanation, Demonstration, Tactile cues, Verbal cues, and Handouts Education comprehension: verbalized understanding, returned demonstration, verbal cues required, tactile cues required, and needs further education   HOME EXERCISE PROGRAM: Access Code: [email or web address] URL: [email or web address] Date: 01/10/2022 Prepared by: Vista Mink  Exercises - Supine Quadricep Sets  - 2 x daily - 7 x weekly - 2-3 sets - 10 reps - 5 second hold - Sidelying Hip Abduction  - 1-2 x daily - 7 x weekly - 2 sets - 10 reps - 5 seconds hold - Prone Hip Extension  - 1-2 x daily - 7 x weekly - 2 sets - 10 reps - 3 seconds hold - Isometric Ankle Inversion at Wall  - 1 x daily - 7 x weekly - 1 sets - 30-50 reps - 5 hold - Isometric Ankle Eversion at Wall  - 1 x daily - 7 x weekly - 1 sets - 30-50 reps - 5 hold - Seated Heel Toe Raises  - 1 x daily - 7 x weekly - 3-5 sets - 10 reps  ASSESSMENT:  CLINICAL IMPRESSION: Patient is a 51 y.o. female who was seen today for physical therapy evaluation and treatment for L Lisfranc dislocation.  Morgin just got out of a boot although she has been relying primarily on her scooter.  We addressed several muscles important for return to WB along with specific dorsiflexion AROM and appropriate ankle ankle strength activities.  She is WBAT and appropriate for standing activities in a shoe along with today's starter HEP.   OBJECTIVE IMPAIRMENTS Abnormal gait, decreased activity  tolerance, decreased balance, decreased endurance, decreased knowledge of condition, difficulty walking, decreased ROM, decreased strength, increased edema, impaired perceived functional ability, impaired flexibility, and pain.   ACTIVITY LIMITATIONS standing, squatting, stairs, and locomotion level  PARTICIPATION LIMITATIONS: cleaning, laundry, community activity, and yard work  PERSONAL FACTORS Time since onset of injury/illness/exacerbation are also affecting patient's functional outcome.   REHAB POTENTIAL: Good  CLINICAL DECISION MAKING: Stable/uncomplicated  EVALUATION COMPLEXITY: Low   GOALS: Goals reviewed with patient? Yes  SHORT TERM GOALS: Target date: 02/07/2022   Liboria will be independent  with her day 1 HEP. Baseline: Started 01/10/2022 Goal status: INITIAL  2.  Brittyn will improve L ankle dorsiflexion to 5 degrees. Baseline: -7 degrees Goal status: INITIAL  3.  Banita will be able to stand on her L foot for 5-10 minutes at a time. Baseline: Poor WB endurance and mostly uses her scooter Goal status: INITIAL   LONG TERM GOALS: Target date: 04/04/2022   Improve FOTO to 59. Baseline: 26 Goal status: INITIAL  2.  Ambera will report L foot pain consistently 0-3/10 on the VAS. Baseline: 4-7/10 Goal status: INITIAL  3.  Improve L ankle dorsiflexion AROM to at least 10 degrees. Baseline: -7 degrees Goal status: INITIAL  4.  Improve L ankle strength to 90% or greater as compared to the uninvolved R. Baseline: Deferred due to early post-surgery.  Will assess after her next set of X-Rays if appropriate. Goal status: INITIAL  5.  Vessie will be able to stand > 30 minutes and walk up to a mile with a cane or lower AD at DC. Baseline: WBAT and uses a scooter most of the time. Goal status: INITIAL  6.  Valynn will be independent with her long-term DC HEP. Baseline: Started 01/10/2022 Goal status: INITIAL   PLAN: PT FREQUENCY: 1-2x/week  PT DURATION: 12  weeks  PLANNED INTERVENTIONS: Therapeutic exercises, Therapeutic activity, Neuromuscular re-education, Balance training, Gait training, Patient/Family education, Joint mobilization, Stair training, Dry Needling, Cryotherapy, Compression bandaging, Vasopneumatic device, and Manual therapy  PLAN FOR NEXT SESSION: Review HEP.  Add balance and WB strength and dorsiflexion activities in a shoe.   Farley Ly, PT, MPT 01/10/2022, 2:14 PM

## 2022-01-15 ENCOUNTER — Telehealth: Payer: Self-pay | Admitting: Radiology

## 2022-01-15 ENCOUNTER — Other Ambulatory Visit: Payer: Self-pay | Admitting: Orthopedic Surgery

## 2022-01-15 MED ORDER — GABAPENTIN 300 MG PO CAPS: 300.0000 mg | ORAL_CAPSULE | Freq: Three times a day (TID) | ORAL | 3 refills | Status: DC

## 2022-01-15 NOTE — Telephone Encounter (Signed)
Please see below and advise. Thanks

## 2022-01-15 NOTE — Telephone Encounter (Signed)
Patient called, said that she is having so much pain postop.  Her foot feels like it is on fire.  She has tried ibu 800 mg, 1/2 tab diazepam to see If that will help, she has oxycodone and has tried 1/2 tab- none of these help.  She has neosporin with lidocaine and that does not help either.  Heat helps, ice does not.  She is following PT instructions.  Asking for something to help with the pain.  Maybe medication for nerve pain?  She has an appt with Dr Sharol Given Thursday 6/1.  Wanted to keep that and ask for meds in interim.  Please call to discuss/advise.  Thanks.

## 2022-01-16 DIAGNOSIS — M5481 Occipital neuralgia: Secondary | ICD-10-CM | POA: Diagnosis not present

## 2022-01-16 DIAGNOSIS — F5104 Psychophysiologic insomnia: Secondary | ICD-10-CM | POA: Diagnosis not present

## 2022-01-16 DIAGNOSIS — G500 Trigeminal neuralgia: Secondary | ICD-10-CM | POA: Diagnosis not present

## 2022-01-16 DIAGNOSIS — G90522 Complex regional pain syndrome I of left lower limb: Secondary | ICD-10-CM | POA: Diagnosis not present

## 2022-01-16 NOTE — Telephone Encounter (Signed)
I called and sw pt yesterday to advise that rx has been sent to pharm and she can try this and to keep appt for Thursday.  To call with any questions.

## 2022-01-17 ENCOUNTER — Ambulatory Visit (INDEPENDENT_AMBULATORY_CARE_PROVIDER_SITE_OTHER): Payer: BC Managed Care – PPO

## 2022-01-17 ENCOUNTER — Ambulatory Visit (INDEPENDENT_AMBULATORY_CARE_PROVIDER_SITE_OTHER): Payer: BC Managed Care – PPO | Admitting: Orthopedic Surgery

## 2022-01-17 DIAGNOSIS — M79672 Pain in left foot: Secondary | ICD-10-CM | POA: Diagnosis not present

## 2022-01-17 DIAGNOSIS — G90522 Complex regional pain syndrome I of left lower limb: Secondary | ICD-10-CM

## 2022-01-17 DIAGNOSIS — S93325S Dislocation of tarsometatarsal joint of left foot, sequela: Secondary | ICD-10-CM

## 2022-01-17 MED ORDER — AMITRIPTYLINE HCL 25 MG PO TABS: 25.0000 mg | ORAL_TABLET | Freq: Every day | ORAL | 3 refills | Status: DC

## 2022-01-17 MED ORDER — DULOXETINE HCL 30 MG PO CPEP: 30.0000 mg | ORAL_CAPSULE | Freq: Every day | ORAL | 1 refills | Status: DC

## 2022-01-18 ENCOUNTER — Encounter: Payer: Self-pay | Admitting: Orthopedic Surgery

## 2022-01-18 NOTE — Progress Notes (Signed)
Office Visit Note   Patient: Brittney Alvarez           Date of Birth: 10-18-1970           MRN: [id number] Visit Date: 01/17/2022              Requested by: Angelina Sheriff, MD [address] [place],  [place] [address] PCP: Angelina Sheriff, MD  Chief Complaint  Patient presents with   Left Foot - Routine Post Op    12/07/2021 left foot lisfranc fusion       HPI: Patient is a 51 year old woman who has developed complex regional pain syndrome she is 6 weeks out from left foot Lisfranc fusion.  She states the foot feels like it is burning she was started on Neurontin which she is now taking once a day which she states does help.  Patient did see a neurologist who also concurred that she has complex regional pain syndrome.  Assessment & Plan: Visit Diagnoses:  1. Pain in left foot   2. Lisfranc dislocation, left, sequela   3. Complex regional pain syndrome i of left lower limb     Plan: Patient will try to increase the Neurontin from once a day to twice a day.  She has also sent in a prescription for Cymbalta.  She states she has not taken the trazodone and a prescription for Elavil was also sent and that she has trouble sleeping.  She will call if there is any changes with her symptoms or any concerns with the medication.  Follow-Up Instructions: Return in about 2 weeks (around 01/31/2022).   Ortho Exam  Patient is alert, oriented, no adenopathy, well-dressed, normal affect, normal respiratory effort. Examination patient has red thin and shiny skin of the entire foot there is a petechial rash she has hypersensitivity to light touch the incision is well-healed there is no signs of infection.  Imaging: No results found. No images are attached to the encounter.  Labs: No results found for: HGBA1C, ESRSEDRATE, CRP, LABURIC, REPTSTATUS, GRAMSTAIN, CULT, LABORGA   No results found for: ALBUMIN, PREALBUMIN, CBC  No results found for: MG No results found for: VD25OH  No  results found for: PREALBUMIN    Latest Ref Rng & Units 12/07/2021    8:53 AM  CBC EXTENDED  WBC 4.0 - 10.5 K/uL 3.5    RBC 3.87 - 5.11 MIL/uL 4.92    Hemoglobin 12.0 - 15.0 g/dL 13.5    HCT 36.0 - 46.0 % 41.1    Platelets 150 - 400 K/uL 216       There is no height or weight on file to calculate BMI.  Orders:  Orders Placed This Encounter  Procedures   XR Foot Complete Left   Meds ordered this encounter  Medications   DULoxetine (CYMBALTA) 30 MG capsule    Sig: Take 1 capsule (30 mg total) by mouth daily. Qam    Dispense:  30 capsule    Refill:  1   amitriptyline (ELAVIL) 25 MG tablet    Sig: Take 1 tablet (25 mg total) by mouth at bedtime.    Dispense:  30 tablet    Refill:  3     Procedures: No procedures performed  Clinical Data: No additional findings.  ROS:  All other systems negative, except as noted in the HPI. Review of Systems  Objective: Vital Signs: There were no vitals taken for this visit.  Specialty Comments:  No specialty comments  available.  PMFS History: Patient Active Problem List   Diagnosis Date Noted   Lisfranc dislocation, left, sequela    ADD (attention deficit disorder) 04/09/2019   Menorrhagia with regular cycle 04/09/2019   Lesion of radial nerve 12/15/2018   Occipital neuralgia of left side 01/30/2018   Right lateral epicondylitis 11/19/2017   Past Medical History:  Diagnosis Date   ADHD (attention deficit hyperactivity disorder)    Anxiety    Asthma    years ago   Seasonal allergies     History reviewed. No pertinent family history.  Past Surgical History:  Procedure Laterality Date   ANKLE FUSION Left 12/07/2021   Procedure: LEFT LISFRANC FUSION;  Surgeon: Newt Minion, MD;  Location: [facility];  Service: Orthopedics;  Laterality: Left;   FOOT SURGERY Left    bone spurs   Social History   Occupational History   Not on file  Tobacco Use   Smoking status: Former    Types: Cigarettes    Quit date: 10/2016     Years since quitting: 5.2   Smokeless tobacco: Never  Vaping Use   Vaping Use: Never used  Substance and Sexual Activity   Alcohol use: Not Currently   Drug use: Never   Sexual activity: Not on file

## 2022-01-21 ENCOUNTER — Ambulatory Visit (INDEPENDENT_AMBULATORY_CARE_PROVIDER_SITE_OTHER): Payer: BC Managed Care – PPO | Admitting: Rehabilitative and Restorative Service Providers"

## 2022-01-21 ENCOUNTER — Encounter: Payer: Self-pay | Admitting: Rehabilitative and Restorative Service Providers"

## 2022-01-21 DIAGNOSIS — M6281 Muscle weakness (generalized): Secondary | ICD-10-CM | POA: Diagnosis not present

## 2022-01-21 DIAGNOSIS — M25572 Pain in left ankle and joints of left foot: Secondary | ICD-10-CM

## 2022-01-21 DIAGNOSIS — M25672 Stiffness of left ankle, not elsewhere classified: Secondary | ICD-10-CM | POA: Diagnosis not present

## 2022-01-21 DIAGNOSIS — R600 Localized edema: Secondary | ICD-10-CM | POA: Diagnosis not present

## 2022-01-21 DIAGNOSIS — R262 Difficulty in walking, not elsewhere classified: Secondary | ICD-10-CM

## 2022-01-21 NOTE — Therapy (Signed)
OUTPATIENT PHYSICAL THERAPY TREATMENT NOTE   Patient Name: Brittney Alvarez MRN: [id number] DOB:[birth year], 51 y.o., female Today's Date: 01/21/2022  PCP: Jacqlyn Larsen, MD REFERRING PROVIDER: Newt Minion, MD  END OF SESSION:   PT End of Session - 01/21/22 1509     Visit Number 2    Number of Visits 18    Progress Note Due on Visit 10    PT Start Time 1430    PT Stop Time 1512    PT Time Calculation (min) 42 min    Activity Tolerance Patient tolerated treatment well;No increased pain    Behavior During Therapy WFL for tasks assessed/performed             Past Medical History:  Diagnosis Date   ADHD (attention deficit hyperactivity disorder)    Anxiety    Asthma    years ago   Seasonal allergies    Past Surgical History:  Procedure Laterality Date   ANKLE FUSION Left 12/07/2021   Procedure: LEFT LISFRANC FUSION;  Surgeon: Newt Minion, MD;  Location: [facility];  Service: Orthopedics;  Laterality: Left;   FOOT SURGERY Left    bone spurs   Patient Active Problem List   Diagnosis Date Noted   Lisfranc dislocation, left, sequela    ADD (attention deficit disorder) 04/09/2019   Menorrhagia with regular cycle 04/09/2019   Lesion of radial nerve 12/15/2018   Occipital neuralgia of left side 01/30/2018   Right lateral epicondylitis 11/19/2017    REFERRING DIAG: S93.325S (ICD-10-CM) - Lisfranc dislocation, left, sequela   THERAPY DIAG:  Difficulty in walking, not elsewhere classified  Muscle weakness (generalized)  Localized edema  Stiffness of left ankle, not elsewhere classified  Pain in left ankle and joints of left foot  Rationale for Evaluation and Treatment Rehabilitation  PERTINENT HISTORY: ADD, asthma  PRECAUTIONS: None  SUBJECTIVE: Brittney Alvarez reports excellent early HEP compliance.  She showed up to PT in a flip-flop because "swelling doesn't let me wear anything else."  PAIN:  Are you having pain? Yes: NPRS scale: 3-7/10 Pain location: L foot Pain  description: Ache, sharp Aggravating factors: Too much WB, very sensitive to the touch Relieving factors: Ibuprofen, heat   OBJECTIVE: (objective measures completed at initial evaluation unless otherwise dated)  OBJECTIVE:    DIAGNOSTIC FINDINGS: Healing post-surgery (see imaging)   PATIENT SURVEYS:  FOTO 26 (Goal 59 in 18 visits)   COGNITION:           Overall cognitive status: Within functional limits for tasks assessed                          SENSATION: Denies tingling, some paresthesias around the scar   EDEMA:  Present and not objectively assessed   LOWER EXTREMITY ROM:   Active ROM Right eval Left eval  Hip flexion      Hip extension      Hip abduction      Hip adduction      Hip internal rotation      Hip external rotation      Knee flexion      Knee extension      Ankle dorsiflexion      Ankle plantarflexion 1 -7  Ankle inversion      Ankle eversion       (Blank rows = not tested)   LOWER EXTREMITY MMT:   MMT Deferred due to time post-surgery Right eval Left eval  Hip flexion  Hip extension      Hip abduction      Hip adduction      Hip internal rotation      Hip external rotation      Knee flexion      Knee extension      Ankle dorsiflexion      Ankle plantarflexion      Ankle inversion      Ankle eversion       (Blank rows = not tested)   GAIT:   Comments: Brittney Alvarez is using a scooter but reports taking a few steps WBAT at home and in her office.  I encouraged her to bring a shoe so we can look at gait and do more WB activities on her next PT visit.       TODAY'S TREATMENT: 01/21/2022  Exercises - Supine Quadricep Sets  - 10 reps - 5 second hold - Sidelying Hip Abduction  - 10 reps - 5 seconds hold - Prone Hip Extension  - 2 sets - 10 reps - 3 seconds hold - Isometric Ankle Inversion at Wall  - 20 reps - 5 hold - Isometric Ankle Eversion at Wall  - 20 reps - 5 hold - Seated Heel Toe Raises  - 3 sets - 10 reps   - Heel cord slant  board 3X 1 minute with slight toe in (knees straight)  - Heel to toe raises (avoid rocking, don't stick butt back with toe raises) 10X 3 seconds  Neuromuscular re-education: Tandem balance eyes open; head turning; eyes closed 3X each 20 seconds   01/10/2022   Exercises - Supine Quadricep Sets  - 10 reps - 5 second hold - Sidelying Hip Abduction  - 10 reps - 5 seconds hold - Prone Hip Extension  - 10 reps - 3 seconds hold - Isometric Ankle Inversion at Wall  - 20 reps - 5 hold - Isometric Ankle Eversion at Wall  - 20 reps - 5 hold - Seated Heel Toe Raises  - 3 sets - 10 reps     PATIENT EDUCATION:  Education details: See above Person educated: Patient Education method: Explanation, Demonstration, Tactile cues, Verbal cues, and Handouts Education comprehension: verbalized understanding, returned demonstration, verbal cues required, tactile cues required, and needs further education     HOME EXERCISE PROGRAM: Access Code: [email or web address] URL: [email or web address] Date: 01/21/2022 Prepared by: Vista Mink  Exercises - Supine Quadricep Sets  - 2 x daily - 7 x weekly - 2-3 sets - 10 reps - 5 second hold - Sidelying Hip Abduction  - 1-2 x daily - 7 x weekly - 2 sets - 10 reps - 5 seconds hold - Prone Hip Extension  - 1-2 x daily - 7 x weekly - 2 sets - 10 reps - 3 seconds hold - Isometric Ankle Inversion at Wall  - 1 x daily - 7 x weekly - 1 sets - 30-50 reps - 5 hold - Isometric Ankle Eversion at Wall  - 1 x daily - 7 x weekly - 1 sets - 30-50 reps - 5 hold - Seated Heel Toe Raises  - 1 x daily - 7 x weekly - 3-5 sets - 10 reps - Slant Board Gastrocnemius Stretch  - 2-3 x daily - 7 x weekly - 1 sets - 4-5 reps - 60 hold - Heel Toe Raises with Counter Support  - 3-5 x daily - 7 x weekly - 1 sets - 10 reps - 3 seconds hold - Tandem Stance  -  2-3 x daily - 7 x weekly - 1 sets - 5 reps - 20 second hold    ASSESSMENT:   CLINICAL IMPRESSION: Brittney Alvarez reports good early HEP  compliance.  She did need some cuing for side lie hip abduction, but otherwise looked good.  New activities were added to progress weight-bearing function.  Continue strength, AROM and balance activities to meet LTGs.     OBJECTIVE IMPAIRMENTS Abnormal gait, decreased activity tolerance, decreased balance, decreased endurance, decreased knowledge of condition, difficulty walking, decreased ROM, decreased strength, increased edema, impaired perceived functional ability, impaired flexibility, and pain.    ACTIVITY LIMITATIONS standing, squatting, stairs, and locomotion level   PARTICIPATION LIMITATIONS: cleaning, laundry, community activity, and yard work   PERSONAL FACTORS Time since onset of injury/illness/exacerbation are also affecting patient's functional outcome.    REHAB POTENTIAL: Good   CLINICAL DECISION MAKING: Stable/uncomplicated   EVALUATION COMPLEXITY: Low     GOALS: Goals reviewed with patient? Yes   SHORT TERM GOALS: Target date: 02/07/2022    Brittney Alvarez will be independent with her day 1 HEP. Baseline: Started 01/10/2022 Goal status: Met 01/21/2022   2.  Brittney Alvarez will improve L ankle dorsiflexion to 5 degrees. Baseline: -7 degrees Goal status: INITIAL   3.  Brittney Alvarez will be able to stand on her L foot for 5-10 minutes at a time. Baseline: Poor WB endurance and mostly uses her scooter Goal status: INITIAL     LONG TERM GOALS: Target date: 04/04/2022    Improve FOTO to 59. Baseline: 26 Goal status: INITIAL   2.  Brittney Alvarez will report L foot pain consistently 0-3/10 on the VAS. Baseline: 4-7/10 Goal status: INITIAL   3.  Improve L ankle dorsiflexion AROM to at least 10 degrees. Baseline: -7 degrees Goal status: INITIAL   4.  Improve L ankle strength to 90% or greater as compared to the uninvolved R. Baseline: Deferred due to early post-surgery.  Will assess after her next set of X-Rays if appropriate. Goal status: INITIAL   5.  Brittney Alvarez will be able to stand > 30 minutes  and walk up to a mile with a cane or lower AD at DC. Baseline: WBAT and uses a scooter most of the time. Goal status: INITIAL   6.  Brittney Alvarez will be independent with her long-term DC HEP. Baseline: Started 01/10/2022 Goal status: INITIAL     PLAN: PT FREQUENCY: 1-2x/week   PT DURATION: 12 weeks   PLANNED INTERVENTIONS: Therapeutic exercises, Therapeutic activity, Neuromuscular re-education, Balance training, Gait training, Patient/Family education, Joint mobilization, Stair training, Dry Needling, Cryotherapy, Compression bandaging, Vasopneumatic device, and Manual therapy   PLAN FOR NEXT SESSION: Review updated HEP.  Progress balance and functional activities in a shoe.     Farley Ly, PT, MPT 01/21/2022, 3:14 PM

## 2022-01-24 ENCOUNTER — Ambulatory Visit (INDEPENDENT_AMBULATORY_CARE_PROVIDER_SITE_OTHER): Payer: BC Managed Care – PPO | Admitting: Orthopedic Surgery

## 2022-01-24 DIAGNOSIS — G90522 Complex regional pain syndrome I of left lower limb: Secondary | ICD-10-CM

## 2022-01-24 DIAGNOSIS — S93325S Dislocation of tarsometatarsal joint of left foot, sequela: Secondary | ICD-10-CM

## 2022-01-25 ENCOUNTER — Ambulatory Visit (INDEPENDENT_AMBULATORY_CARE_PROVIDER_SITE_OTHER): Payer: BC Managed Care – PPO | Admitting: Rehabilitative and Restorative Service Providers"

## 2022-01-25 ENCOUNTER — Encounter: Payer: Self-pay | Admitting: Rehabilitative and Restorative Service Providers"

## 2022-01-25 DIAGNOSIS — R600 Localized edema: Secondary | ICD-10-CM | POA: Diagnosis not present

## 2022-01-25 DIAGNOSIS — M25572 Pain in left ankle and joints of left foot: Secondary | ICD-10-CM

## 2022-01-25 DIAGNOSIS — R262 Difficulty in walking, not elsewhere classified: Secondary | ICD-10-CM | POA: Diagnosis not present

## 2022-01-25 DIAGNOSIS — M6281 Muscle weakness (generalized): Secondary | ICD-10-CM | POA: Diagnosis not present

## 2022-01-25 DIAGNOSIS — M25672 Stiffness of left ankle, not elsewhere classified: Secondary | ICD-10-CM | POA: Diagnosis not present

## 2022-01-25 NOTE — Therapy (Signed)
OUTPATIENT PHYSICAL THERAPY TREATMENT NOTE   Patient Name: Brittney Alvarez MRN: [id number] DOB:10-Jun-1971, 51 y.o., female Today's Date: 01/25/2022  PCP: Jacqlyn Larsen, MD REFERRING PROVIDER: Newt Minion, MD  END OF SESSION:   PT End of Session - 01/25/22 1436     Visit Number 3    Number of Visits 18    Progress Note Due on Visit 10    PT Start Time 1430    PT Stop Time 1520    PT Time Calculation (min) 50 min    Activity Tolerance Patient tolerated treatment well;No increased pain    Behavior During Therapy WFL for tasks assessed/performed              Past Medical History:  Diagnosis Date   ADHD (attention deficit hyperactivity disorder)    Anxiety    Asthma    years ago   Seasonal allergies    Past Surgical History:  Procedure Laterality Date   ANKLE FUSION Left 12/07/2021   Procedure: LEFT LISFRANC FUSION;  Surgeon: Newt Minion, MD;  Location: [facility];  Service: Orthopedics;  Laterality: Left;   FOOT SURGERY Left    bone spurs   Patient Active Problem List   Diagnosis Date Noted   Lisfranc dislocation, left, sequela    ADD (attention deficit disorder) 04/09/2019   Menorrhagia with regular cycle 04/09/2019   Lesion of radial nerve 12/15/2018   Occipital neuralgia of left side 01/30/2018   Right lateral epicondylitis 11/19/2017    REFERRING DIAG: S93.325S (ICD-10-CM) - Lisfranc dislocation, left, sequela   THERAPY DIAG:  Difficulty in walking, not elsewhere classified  Muscle weakness (generalized)  Localized edema  Stiffness of left ankle, not elsewhere classified  Pain in left ankle and joints of left foot  Rationale for Evaluation and Treatment Rehabilitation  PERTINENT HISTORY: ADD, asthma  PRECAUTIONS: None  SUBJECTIVE: [name] reports she was very tired/fatigued after her last PT visit.  She has been doing her HEP "fairly consistently."  Pain is more skin than foot or joint.  PAIN:  Are you having pain? Yes: NPRS scale: 3-4/10 Pain  location: L foot Pain description: Ache, sharp Aggravating factors: Too much WB, very sensitive to the touch Relieving factors: Ibuprofen, heat   OBJECTIVE: (objective measures completed at initial evaluation unless otherwise dated)  OBJECTIVE:    DIAGNOSTIC FINDINGS: Healing post-surgery (see imaging)   PATIENT SURVEYS:  FOTO 26 (Goal 59 in 18 visits)   COGNITION:           Overall cognitive status: Within functional limits for tasks assessed                          SENSATION: Denies tingling, some paresthesias around the scar   EDEMA:  Present and not objectively assessed   LOWER EXTREMITY ROM:   Active ROM Right 01/10/2022 Left 01/10/2022  Hip flexion      Hip extension      Hip abduction      Hip adduction      Hip internal rotation      Hip external rotation      Knee flexion      Knee extension      Ankle dorsiflexion      Ankle plantarflexion 1 -7  Ankle inversion      Ankle eversion       (Blank rows = not tested)   LOWER EXTREMITY MMT:   MMT Deferred due to time post-surgery  Right eval Left eval  Hip flexion      Hip extension      Hip abduction      Hip adduction      Hip internal rotation      Hip external rotation      Knee flexion      Knee extension      Ankle dorsiflexion      Ankle plantarflexion      Ankle inversion      Ankle eversion       (Blank rows = not tested)   GAIT:   Comments: [name] is using a scooter but reports taking a few steps WBAT at home and in her office.  I encouraged her to bring a shoe so we can look at gait and do more WB activities on her next PT visit.       TODAY'S TREATMENT: 01/25/2022  -Recumbent bike Seat 6 Resistance level 8 for 5 minutes - Supine Quadricep Sets  - 20 reps - 5 second hold - Sidelying Hip Abduction  -2 sets - 10 reps - 5 seconds hold - Prone Hip Extension  - 2 sets - 10 reps - 3 seconds hold - Theraband Ankle Inversion Red  - 20 reps - 3 hold - Theraband Ankle Eversion Red  - 20 reps  - 3 hold   - Heel cord slant board 1 minute each (knees straight and bent) with slight toe in (knees straight)  - Heel to toe raises (avoid rocking, don't stick butt back with toe raises) 20X 3 seconds  Neuromuscular re-education: Tandem balance eyes open; head turning; eyes closed 3X each 20 seconds   01/21/2022  Exercises - Supine Quadricep Sets  - 10 reps - 5 second hold - Sidelying Hip Abduction  - 10 reps - 5 seconds hold - Prone Hip Extension  - 2 sets - 10 reps - 3 seconds hold - Isometric Ankle Inversion at Wall  - 20 reps - 5 hold - Isometric Ankle Eversion at Wall  - 20 reps - 5 hold - Seated Heel Toe Raises  - 3 sets - 10 reps   - Heel cord slant board 3X 1 minute with slight toe in (knees straight)  - Heel to toe raises (avoid rocking, don't stick butt back with toe raises) 10X 3 seconds  Neuromuscular re-education: Tandem balance eyes open; head turning; eyes closed 3X each 20 seconds   01/10/2022   Exercises - Supine Quadricep Sets  - 10 reps - 5 second hold - Sidelying Hip Abduction  - 10 reps - 5 seconds hold - Prone Hip Extension  - 10 reps - 3 seconds hold - Isometric Ankle Inversion at Wall  - 20 reps - 5 hold - Isometric Ankle Eversion at Wall  - 20 reps - 5 hold - Seated Heel Toe Raises  - 3 sets - 10 reps     PATIENT EDUCATION:  Education details: See above Person educated: Patient Education method: Explanation, Demonstration, Tactile cues, Verbal cues, and Handouts Education comprehension: verbalized understanding, returned demonstration, verbal cues required, tactile cues required, and needs further education     HOME EXERCISE PROGRAM: Access Code: [email or web address] URL: [email or web address] Date: 01/21/2022 Prepared by: Vista Mink  Exercises - Supine Quadricep Sets  - 2 x daily - 7 x weekly - 2-3 sets - 10 reps - 5 second hold - Sidelying Hip Abduction  - 1-2 x daily - 7 x weekly - 2 sets - 10 reps - 5 seconds  hold - Prone Hip  Extension  - 1-2 x daily - 7 x weekly - 2 sets - 10 reps - 3 seconds hold - Isometric Ankle Inversion at Wall  - 1 x daily - 7 x weekly - 1 sets - 30-50 reps - 5 hold - Isometric Ankle Eversion at Wall  - 1 x daily - 7 x weekly - 1 sets - 30-50 reps - 5 hold - Seated Heel Toe Raises  - 1 x daily - 7 x weekly - 3-5 sets - 10 reps - Slant Board Gastrocnemius Stretch  - 2-3 x daily - 7 x weekly - 1 sets - 4-5 reps - 60 hold - Heel Toe Raises with Counter Support  - 3-5 x daily - 7 x weekly - 1 sets - 10 reps - 3 seconds hold - Tandem Stance  - 2-3 x daily - 7 x weekly - 1 sets - 5 reps - 20 second hold    ASSESSMENT:   CLINICAL IMPRESSION: Ferol reports fatigue but no pain with her home exercises.  Tenderness around the incision is the main source of pain.  She did a better job with her side lie hip abduction and was able to progress her resisted ankle strength.  Continue strength, AROM, functional and balance activities to meet LTGs.     OBJECTIVE IMPAIRMENTS Abnormal gait, decreased activity tolerance, decreased balance, decreased endurance, decreased knowledge of condition, difficulty walking, decreased ROM, decreased strength, increased edema, impaired perceived functional ability, impaired flexibility, and pain.    ACTIVITY LIMITATIONS standing, squatting, stairs, and locomotion level   PARTICIPATION LIMITATIONS: cleaning, laundry, community activity, and yard work   PERSONAL FACTORS Time since onset of injury/illness/exacerbation are also affecting patient's functional outcome.    REHAB POTENTIAL: Good   CLINICAL DECISION MAKING: Stable/uncomplicated   EVALUATION COMPLEXITY: Low     GOALS: Goals reviewed with patient? Yes   SHORT TERM GOALS: Target date: 02/07/2022    Pressley will be independent with her day 1 HEP. Baseline: Started 01/10/2022 Goal status: Met 01/21/2022   2.  Pihu will improve L ankle dorsiflexion to 5 degrees. Baseline: -7 degrees Goal status: INITIAL   3.   Ardenia will be able to stand on her L foot for 5-10 minutes at a time. Baseline: Poor WB endurance and mostly uses her scooter Goal status: INITIAL     LONG TERM GOALS: Target date: 04/04/2022    Improve FOTO to 59. Baseline: 26 Goal status: INITIAL   2.  Hetvi will report L foot pain consistently 0-3/10 on the VAS. Baseline: 4-7/10 Goal status: INITIAL   3.  Improve L ankle dorsiflexion AROM to at least 10 degrees. Baseline: -7 degrees Goal status: INITIAL   4.  Improve L ankle strength to 90% or greater as compared to the uninvolved R. Baseline: Deferred due to early post-surgery.  Will assess after her next set of X-Rays if appropriate. Goal status: INITIAL   5.  Melenie will be able to stand > 30 minutes and walk up to a mile with a cane or lower AD at DC. Baseline: WBAT and uses a scooter most of the time. Goal status: INITIAL   6.  Keana will be independent with her long-term DC HEP. Baseline: Started 01/10/2022 Goal status: INITIAL     PLAN: PT FREQUENCY: 1-2x/week   PT DURATION: 12 weeks   PLANNED INTERVENTIONS: Therapeutic exercises, Therapeutic activity, Neuromuscular re-education, Balance training, Gait training, Patient/Family education, Joint mobilization, Stair training, Dry Needling, Cryotherapy, Compression  bandaging, Vasopneumatic device, and Manual therapy   PLAN FOR NEXT SESSION: Review HEP.  Progress balance and functional activities in a shoe.  FOTO and progress note soon.     Farley Ly, PT, MPT 01/25/2022, 4:01 PM

## 2022-01-27 ENCOUNTER — Encounter: Payer: Self-pay | Admitting: Orthopedic Surgery

## 2022-01-27 NOTE — Progress Notes (Signed)
Office Visit Note   Patient: Brittney Alvarez           Date of Birth: [birth year]           MRN: [id number] Visit Date: 12/20/2021              Requested by: Angelina Sheriff, MD [address] [place],  [place] [address] PCP: Angelina Sheriff, MD  Chief Complaint  Patient presents with   Left Foot - Routine Post Op      HPI: Patient is a 51 year old woman who presents status post Lisfranc fusion on April 21.  Patient is currently in a knee scooter states she has swelling and shooting pain every once in a while.  Assessment & Plan: Visit Diagnoses:  1. Lisfranc dislocation, left, sequela     Plan: Stitches are harvested she is given a compression sock.  Follow-up in 2 weeks.  Continue nonweightbearing.  Repeat radiographs at follow-up.  Follow-Up Instructions: Return in about 2 weeks (around 01/03/2022).   Ortho Exam  Patient is alert, oriented, no adenopathy, well-dressed, normal affect, normal respiratory effort. Examination there is minimal swelling the incision is well-healed sutures are harvested.  Imaging: No results found. No images are attached to the encounter.  Labs: No results found for: "HGBA1C", "ESRSEDRATE", "CRP", "LABURIC", "REPTSTATUS", "GRAMSTAIN", "CULT", "LABORGA"   No results found for: "ALBUMIN", "PREALBUMIN", "CBC"  No results found for: "MG" No results found for: "VD25OH"  No results found for: "PREALBUMIN"    Latest Ref Rng & Units 12/07/2021    8:53 AM  CBC EXTENDED  WBC 4.0 - 10.5 K/uL 3.5   RBC 3.87 - 5.11 MIL/uL 4.92   Hemoglobin 12.0 - 15.0 g/dL 13.5   HCT 36.0 - 46.0 % 41.1   Platelets 150 - 400 K/uL 216      There is no height or weight on file to calculate BMI.  Orders:  Orders Placed This Encounter  Procedures   XR Foot Complete Left   No orders of the defined types were placed in this encounter.    Procedures: No procedures performed  Clinical Data: No additional findings.  ROS:  All other systems  negative, except as noted in the HPI. Review of Systems  Objective: Vital Signs: LMP 12/01/2021   Specialty Comments:  No specialty comments available.  PMFS History: Patient Active Problem List   Diagnosis Date Noted   Lisfranc dislocation, left, sequela    ADD (attention deficit disorder) 04/09/2019   Menorrhagia with regular cycle 04/09/2019   Lesion of radial nerve 12/15/2018   Occipital neuralgia of left side 01/30/2018   Right lateral epicondylitis 11/19/2017   Past Medical History:  Diagnosis Date   ADHD (attention deficit hyperactivity disorder)    Anxiety    Asthma    years ago   Seasonal allergies     History reviewed. No pertinent family history.  Past Surgical History:  Procedure Laterality Date   ANKLE FUSION Left 12/07/2021   Procedure: LEFT LISFRANC FUSION;  Surgeon: Newt Minion, MD;  Location: [facility];  Service: Orthopedics;  Laterality: Left;   FOOT SURGERY Left    bone spurs   Social History   Occupational History   Not on file  Tobacco Use   Smoking status: Former    Types: Cigarettes    Quit date: 10/2016    Years since quitting: 5.2   Smokeless tobacco: Never  Vaping Use   Vaping Use: Never used  Substance and  Sexual Activity   Alcohol use: Not Currently   Drug use: Never   Sexual activity: Not on file

## 2022-01-28 ENCOUNTER — Encounter: Payer: BC Managed Care – PPO | Admitting: Rehabilitative and Restorative Service Providers"

## 2022-01-29 ENCOUNTER — Ambulatory Visit (INDEPENDENT_AMBULATORY_CARE_PROVIDER_SITE_OTHER): Payer: BC Managed Care – PPO | Admitting: Physical Therapy

## 2022-01-29 ENCOUNTER — Encounter: Payer: Self-pay | Admitting: Physical Therapy

## 2022-01-29 ENCOUNTER — Other Ambulatory Visit: Payer: Self-pay | Admitting: Surgical

## 2022-01-29 DIAGNOSIS — R262 Difficulty in walking, not elsewhere classified: Secondary | ICD-10-CM | POA: Diagnosis not present

## 2022-01-29 DIAGNOSIS — M6281 Muscle weakness (generalized): Secondary | ICD-10-CM

## 2022-01-29 DIAGNOSIS — R600 Localized edema: Secondary | ICD-10-CM

## 2022-01-29 DIAGNOSIS — M25672 Stiffness of left ankle, not elsewhere classified: Secondary | ICD-10-CM | POA: Diagnosis not present

## 2022-01-29 DIAGNOSIS — M25572 Pain in left ankle and joints of left foot: Secondary | ICD-10-CM

## 2022-01-29 NOTE — Therapy (Signed)
OUTPATIENT PHYSICAL THERAPY TREATMENT NOTE   Patient Name: Brittney Alvarez MRN: [id number] DOB:01/10/1971, 51 y.o., female Today's Date: 01/29/2022  PCP: John F Redding, MD REFERRING PROVIDER: Marcus V Duda, MD  END OF SESSION:   PT End of Session - 01/29/22 1129     Visit Number 4    Number of Visits 18    PT Start Time 1105    PT Stop Time 1145    PT Time Calculation (min) 40 min    Activity Tolerance Patient tolerated treatment well    Behavior During Therapy WFL for tasks assessed/performed               Past Medical History:  Diagnosis Date   ADHD (attention deficit hyperactivity disorder)    Anxiety    Asthma    years ago   Seasonal allergies    Past Surgical History:  Procedure Laterality Date   ANKLE FUSION Left 12/07/2021   Procedure: LEFT LISFRANC FUSION;  Surgeon: Duda, Marcus V, MD;  Location: [facility];  Service: Orthopedics;  Laterality: Left;   FOOT SURGERY Left    bone spurs   Patient Active Problem List   Diagnosis Date Noted   Lisfranc dislocation, left, sequela    ADD (attention deficit disorder) 04/09/2019   Menorrhagia with regular cycle 04/09/2019   Lesion of radial nerve 12/15/2018   Occipital neuralgia of left side 01/30/2018   Right lateral epicondylitis 11/19/2017    REFERRING DIAG: S93.325S (ICD-10-CM) - Lisfranc dislocation, left, sequela   THERAPY DIAG:  Difficulty in walking, not elsewhere classified  Muscle weakness (generalized)  Localized edema  Stiffness of left ankle, not elsewhere classified  Pain in left ankle and joints of left foot  Rationale for Evaluation and Treatment Rehabilitation  PERTINENT HISTORY: ADD, asthma  PRECAUTIONS: None  SUBJECTIVE: Pt arriving reporting 8/10 pain in her left foot. 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   PAIN:  Are you having pain? Yes: NPRS scale: 8/10 Pain location: L foot Pain description: Ache, sharp Aggravating factors: Too much WB, very sensitive to the  touch Relieving factors: Ibuprofen, heat   OBJECTIVE: (objective measures completed at initial evaluation unless otherwise dated)  OBJECTIVE:    DIAGNOSTIC FINDINGS: Healing post-surgery (see imaging)   PATIENT SURVEYS:  FOTO 26 (Goal 59 in 18 visits) 01/29/22: FOTO 50%   COGNITION:           Overall cognitive status: Within functional limits for tasks assessed                          SENSATION: Denies tingling, some paresthesias around the scar   EDEMA:  Present and not objectively assessed   LOWER EXTREMITY ROM:   Active/Passive ROM Right 01/10/2022 Left 01/10/2022  Left 01/29/22 Passive  Hip flexion       Hip extension       Hip abduction       Hip adduction       Hip internal rotation       Hip external rotation       Knee flexion       Knee extension       Ankle dorsiflexion     10  Ankle plantarflexion 1 -7 12  Ankle inversion     22  Ankle eversion     10   (Blank rows = not tested)   LOWER EXTREMITY MMT:  Deferred due to time post-surgery on eval     MMT  Right 01/29/22 Left 01/29/22  Hip flexion 5/5  5/5   Hip extension      Hip abduction 5/5  5/5   Hip adduction 5/5  5/5   Hip internal rotation      Hip external rotation      Knee flexion  5/5 5/5   Knee extension  5/5  5/5  Ankle dorsiflexion 5/5  3/5   Ankle plantarflexion 5/5  3/5   Ankle inversion  5/5  3/5  Ankle eversion  5/5 3/5    (Blank rows = not tested)   GAIT:   Comments: Keyaira is using a scooter but reports taking a few steps WBAT at home and in her office.  I encouraged her to bring a shoe so we can look at gait and do more WB activities on her next PT visit.       TODAY'S TREATMENT: 01/29/2022 TherEx:  Recumbent bike: Scifit: seat 10, Level 2 x 6 minutes Supine Quadricep Sets  - 20 reps - 5 second hold Sidelying Hip Abduction  -2 sets - 10 reps - 5 seconds hold Prone Hip Extension  - 2 sets - 10 reps - 3 seconds hold Theraband Ankle Inversion Red  - 20 reps - 3  hold Theraband Ankle Eversion Red  - 20 reps - 3 hold Heel cord slant board 1 minute each (knees straight and bent) Heel to toe raises x 20 Standing on Rocker Bocker board x 1 minute Neuromuscular Re-education:  SLS on Airex x 30 seconds with UE support as needed  Fitter First round board x 1 minute in parallel bars Rocker board x 1 minute c UE support     01/25/2022  -Recumbent bike Seat 6 Resistance level 8 for 5 minutes - Supine Quadricep Sets  - 20 reps - 5 second hold - Sidelying Hip Abduction  -2 sets - 10 reps - 5 seconds hold - Prone Hip Extension  - 2 sets - 10 reps - 3 seconds hold - Theraband Ankle Inversion Red  - 20 reps - 3 hold - Theraband Ankle Eversion Red  - 20 reps - 3 hold   - Heel cord slant board 1 minute each (knees straight and bent) with slight toe in (knees straight)  - Heel to toe raises (avoid rocking, don't stick butt back with toe raises) 20X 3 seconds  Neuromuscular re-education: Tandem balance eyes open; head turning; eyes closed 3X each 20 seconds   01/21/2022  Exercises - Supine Quadricep Sets  - 10 reps - 5 second hold - Sidelying Hip Abduction  - 10 reps - 5 seconds hold - Prone Hip Extension  - 2 sets - 10 reps - 3 seconds hold - Isometric Ankle Inversion at Wall  - 20 reps - 5 hold - Isometric Ankle Eversion at Wall  - 20 reps - 5 hold - Seated Heel Toe Raises  - 3 sets - 10 reps   - Heel cord slant board 3X 1 minute with slight toe in (knees straight)  - Heel to toe raises (avoid rocking, don't stick butt back with toe raises) 10X 3 seconds  Neuromuscular re-education: Tandem balance eyes open; head turning; eyes closed 3X each 20 seconds   01/10/2022   Exercises - Supine Quadricep Sets  - 10 reps - 5 second hold - Sidelying Hip Abduction  - 10 reps - 5 seconds hold - Prone Hip Extension  - 10 reps - 3 seconds hold - Isometric Ankle Inversion at Wall  -  20 reps - 5 hold - Isometric Ankle Eversion at Wall  - 20 reps - 5 hold - Seated  Heel Toe Raises  - 3 sets - 10 reps     PATIENT EDUCATION:  Education details: See above Person educated: Patient Education method: Explanation, Demonstration, Tactile cues, Verbal cues, and Handouts Education comprehension: verbalized understanding, returned demonstration, verbal cues required, tactile cues required, and needs further education     HOME EXERCISE PROGRAM: Access Code: [email or web address] URL: [email or web address] Date: 01/21/2022 Prepared by: Vista Mink  Exercises - Supine Quadricep Sets  - 2 x daily - 7 x weekly - 2-3 sets - 10 reps - 5 second hold - Sidelying Hip Abduction  - 1-2 x daily - 7 x weekly - 2 sets - 10 reps - 5 seconds hold - Prone Hip Extension  - 1-2 x daily - 7 x weekly - 2 sets - 10 reps - 3 seconds hold - Isometric Ankle Inversion at Wall  - 1 x daily - 7 x weekly - 1 sets - 30-50 reps - 5 hold - Isometric Ankle Eversion at Wall  - 1 x daily - 7 x weekly - 1 sets - 30-50 reps - 5 hold - Seated Heel Toe Raises  - 1 x daily - 7 x weekly - 3-5 sets - 10 reps - Slant Board Gastrocnemius Stretch  - 2-3 x daily - 7 x weekly - 1 sets - 4-5 reps - 60 hold - Heel Toe Raises with Counter Support  - 3-5 x daily - 7 x weekly - 1 sets - 10 reps - 3 seconds hold - Tandem Stance  - 2-3 x daily - 7 x weekly - 1 sets - 5 reps - 20 second hold    ASSESSMENT:   CLINICAL IMPRESSION: Pt reporting 8/10 pain in her left foot today. Pt tolerating all exercises well. New objective data performed today. Continue with pt's current HEP and skilled PT to maximize pt's function.      OBJECTIVE IMPAIRMENTS Abnormal gait, decreased activity tolerance, decreased balance, decreased endurance, decreased knowledge of condition, difficulty walking, decreased ROM, decreased strength, increased edema, impaired perceived functional ability, impaired flexibility, and pain.    ACTIVITY LIMITATIONS standing, squatting, stairs, and locomotion level   PARTICIPATION LIMITATIONS:  cleaning, laundry, community activity, and yard work   PERSONAL FACTORS Time since onset of injury/illness/exacerbation are also affecting patient's functional outcome.    REHAB POTENTIAL: Good   CLINICAL DECISION MAKING: Stable/uncomplicated   EVALUATION COMPLEXITY: Low     GOALS: Goals reviewed with patient? Yes   SHORT TERM GOALS: Target date: 02/07/2022    Jeani will be independent with her day 1 HEP. Baseline: Started 01/10/2022 Goal status: Met 01/21/2022   2.  Kienna will improve L ankle dorsiflexion to 5 degrees. Baseline: -7 degrees Goal status: MET 01/29/2022   3.  Isla will be able to stand on her L foot for 5-10 minutes at a time. Baseline: Poor WB endurance and mostly uses her scooter Goal status: ongoing 01/29/2022     LONG TERM GOALS: Target date: 04/04/2022    Improve FOTO to 59. Baseline: 26 Goal status: INITIAL   2.  Caileen will report L foot pain consistently 0-3/10 on the VAS. Baseline: 4-7/10 Goal status: INITIAL   3.  Improve L ankle dorsiflexion AROM to at least 10 degrees. Baseline: -7 degrees Goal status: INITIAL   4.  Improve L ankle strength to 90% or greater as compared to the uninvolved R. Baseline:  Deferred due to early post-surgery.  Will assess after her next set of X-Rays if appropriate. Goal status: INITIAL   5.  Daniah will be able to stand > 30 minutes and walk up to a mile with a cane or lower AD at DC. Baseline: WBAT and uses a scooter most of the time. Goal status: INITIAL   6.  Kinley will be independent with her long-term DC HEP. Baseline: Started 01/10/2022 Goal status: INITIAL     PLAN: PT FREQUENCY: 1-2x/week   PT DURATION: 12 weeks   PLANNED INTERVENTIONS: Therapeutic exercises, Therapeutic activity, Neuromuscular re-education, Balance training, Gait training, Patient/Family education, Joint mobilization, Stair training, Dry Needling, Cryotherapy, Compression bandaging, Vasopneumatic device, and Manual therapy   PLAN FOR  NEXT SESSION: Review HEP.  Progress balance and functional activities in a shoe.  FOTO and progress note soon.      R , PT, MPT 01/29/2022, 11:45 AM     

## 2022-02-01 ENCOUNTER — Encounter: Payer: Self-pay | Admitting: Orthopedic Surgery

## 2022-02-01 NOTE — Progress Notes (Signed)
Office Visit Note   Patient: Brittney Alvarez           Date of Birth: [birth year]           MRN: [id number] Visit Date: 01/24/2022              Requested by: Angelina Sheriff, MD [address] [place],  [place] [address] PCP: Angelina Sheriff, MD  Chief Complaint  Patient presents with   Left Foot - Routine Post Op    12/07/2021 left foot lisfranc fusion       HPI: Patient is a 51 year old woman who presents status post open reduction internal fixation for Lisfranc dislocation.  Patient states she still having nerve pain.  She states she had a rash a week ago.  She is currently ambulating in flip-flops.  Patient states she is improving she is on both Neurontin Elavil and Cymbalta.  She states this has helped a lot.  Assessment & Plan: Visit Diagnoses:  1. Lisfranc dislocation, left, sequela   2. Complex regional pain syndrome i of left lower limb     Plan: Continue with physical therapy upstairs  Follow-Up Instructions: Return in about 3 weeks (around 02/14/2022).   Ortho Exam  Patient is alert, oriented, no adenopathy, well-dressed, normal affect, normal respiratory effort. Examination patient does have dependent redness with massage the redness resolved.  The hypersensitivity to touch has decreased.  Imaging: No results found. No images are attached to the encounter.  Labs: No results found for: "HGBA1C", "ESRSEDRATE", "CRP", "LABURIC", "REPTSTATUS", "GRAMSTAIN", "CULT", "LABORGA"   No results found for: "ALBUMIN", "PREALBUMIN", "CBC"  No results found for: "MG" No results found for: "VD25OH"  No results found for: "PREALBUMIN"    Latest Ref Rng & Units 12/07/2021    8:53 AM  CBC EXTENDED  WBC 4.0 - 10.5 K/uL 3.5   RBC 3.87 - 5.11 MIL/uL 4.92   Hemoglobin 12.0 - 15.0 g/dL 13.5   HCT 36.0 - 46.0 % 41.1   Platelets 150 - 400 K/uL 216      There is no height or weight on file to calculate BMI.  Orders:  No orders of the defined types were placed in this  encounter.  No orders of the defined types were placed in this encounter.    Procedures: No procedures performed  Clinical Data: No additional findings.  ROS:  All other systems negative, except as noted in the HPI. Review of Systems  Objective: Vital Signs: There were no vitals taken for this visit.  Specialty Comments:  No specialty comments available.  PMFS History: Patient Active Problem List   Diagnosis Date Noted   Lisfranc dislocation, left, sequela    ADD (attention deficit disorder) 04/09/2019   Menorrhagia with regular cycle 04/09/2019   Lesion of radial nerve 12/15/2018   Occipital neuralgia of left side 01/30/2018   Right lateral epicondylitis 11/19/2017   Past Medical History:  Diagnosis Date   ADHD (attention deficit hyperactivity disorder)    Anxiety    Asthma    years ago   Seasonal allergies     History reviewed. No pertinent family history.  Past Surgical History:  Procedure Laterality Date   ANKLE FUSION Left 12/07/2021   Procedure: LEFT LISFRANC FUSION;  Surgeon: Newt Minion, MD;  Location: [facility];  Service: Orthopedics;  Laterality: Left;   FOOT SURGERY Left    bone spurs   Social History   Occupational History   Not on file  Tobacco Use   Smoking status: Former    Types: Cigarettes    Quit date: 10/2016    Years since quitting: 5.2   Smokeless tobacco: Never  Vaping Use   Vaping Use: Never used  Substance and Sexual Activity   Alcohol use: Not Currently   Drug use: Never   Sexual activity: Not on file

## 2022-02-11 ENCOUNTER — Encounter: Payer: Self-pay | Admitting: Rehabilitative and Restorative Service Providers"

## 2022-02-11 ENCOUNTER — Ambulatory Visit (INDEPENDENT_AMBULATORY_CARE_PROVIDER_SITE_OTHER): Payer: BC Managed Care – PPO | Admitting: Orthopedic Surgery

## 2022-02-11 ENCOUNTER — Ambulatory Visit (INDEPENDENT_AMBULATORY_CARE_PROVIDER_SITE_OTHER): Payer: BC Managed Care – PPO | Admitting: Rehabilitative and Restorative Service Providers"

## 2022-02-11 DIAGNOSIS — R600 Localized edema: Secondary | ICD-10-CM

## 2022-02-11 DIAGNOSIS — M25572 Pain in left ankle and joints of left foot: Secondary | ICD-10-CM

## 2022-02-11 DIAGNOSIS — M6281 Muscle weakness (generalized): Secondary | ICD-10-CM | POA: Diagnosis not present

## 2022-02-11 DIAGNOSIS — R262 Difficulty in walking, not elsewhere classified: Secondary | ICD-10-CM | POA: Diagnosis not present

## 2022-02-11 DIAGNOSIS — M25672 Stiffness of left ankle, not elsewhere classified: Secondary | ICD-10-CM

## 2022-02-11 DIAGNOSIS — G90522 Complex regional pain syndrome I of left lower limb: Secondary | ICD-10-CM

## 2022-02-11 DIAGNOSIS — S93325S Dislocation of tarsometatarsal joint of left foot, sequela: Secondary | ICD-10-CM

## 2022-02-14 ENCOUNTER — Encounter: Payer: BC Managed Care – PPO | Admitting: Rehabilitative and Restorative Service Providers"

## 2022-02-20 ENCOUNTER — Ambulatory Visit (INDEPENDENT_AMBULATORY_CARE_PROVIDER_SITE_OTHER): Payer: BC Managed Care – PPO | Admitting: Rehabilitative and Restorative Service Providers"

## 2022-02-20 ENCOUNTER — Encounter: Payer: Self-pay | Admitting: Rehabilitative and Restorative Service Providers"

## 2022-02-20 ENCOUNTER — Encounter: Payer: Self-pay | Admitting: Orthopedic Surgery

## 2022-02-20 DIAGNOSIS — R262 Difficulty in walking, not elsewhere classified: Secondary | ICD-10-CM | POA: Diagnosis not present

## 2022-02-20 DIAGNOSIS — M25672 Stiffness of left ankle, not elsewhere classified: Secondary | ICD-10-CM | POA: Diagnosis not present

## 2022-02-20 DIAGNOSIS — R600 Localized edema: Secondary | ICD-10-CM

## 2022-02-20 DIAGNOSIS — M6281 Muscle weakness (generalized): Secondary | ICD-10-CM

## 2022-02-20 DIAGNOSIS — M25572 Pain in left ankle and joints of left foot: Secondary | ICD-10-CM

## 2022-02-20 NOTE — Progress Notes (Signed)
Office Visit Note   Patient: Brittney Alvarez           Date of Birth: 09-Apr-1971           MRN: [id number] Visit Date: 02/11/2022              Requested by: Angelina Sheriff, MD [address] [place],  [place] [address] PCP: Angelina Sheriff, MD  Chief Complaint  Patient presents with   Left Foot - Routine Post Op    12/07/21 left foot lisfranc fusion      HPI: Patient is a 51 year old woman who presents 2 months status post fusion Lisfranc fracture dislocation.  Patient is currently doing physical therapy upstairs.  She states she is still having dystrophic skin changes she has developed a rash with sun sensitivity possibly secondary to her nerve medications.  She thinks this is secondary to the Elavil.  Assessment & Plan: Visit Diagnoses:  1. Lisfranc dislocation, left, sequela   2. Complex regional pain syndrome i of left lower limb     Plan: Discontinue the Elavil increase Neurontin to 2 tablets in the evening.  Continue scar massage  Follow-Up Instructions: Return in about 4 weeks (around 03/11/2022).   Ortho Exam  Patient is alert, oriented, no adenopathy, well-dressed, normal affect, normal respiratory effort. Examination the hypersensitivity to light touch is improving.  She does have a rash possibly secondary to the Elavil.  There is no signs of infection.  She is currently doing therapy upstairs.  Imaging: No results found. No images are attached to the encounter.  Labs: No results found for: "HGBA1C", "ESRSEDRATE", "CRP", "LABURIC", "REPTSTATUS", "GRAMSTAIN", "CULT", "LABORGA"   No results found for: "ALBUMIN", "PREALBUMIN", "CBC"  No results found for: "MG" No results found for: "VD25OH"  No results found for: "PREALBUMIN"    Latest Ref Rng & Units 12/07/2021    8:53 AM  CBC EXTENDED  WBC 4.0 - 10.5 K/uL 3.5   RBC 3.87 - 5.11 MIL/uL 4.92   Hemoglobin 12.0 - 15.0 g/dL 13.5   HCT 36.0 - 46.0 % 41.1   Platelets 150 - 400 K/uL 216      There is  no height or weight on file to calculate BMI.  Orders:  No orders of the defined types were placed in this encounter.  No orders of the defined types were placed in this encounter.    Procedures: No procedures performed  Clinical Data: No additional findings.  ROS:  All other systems negative, except as noted in the HPI. Review of Systems  Objective: Vital Signs: There were no vitals taken for this visit.  Specialty Comments:  No specialty comments available.  PMFS History: Patient Active Problem List   Diagnosis Date Noted   Lisfranc dislocation, left, sequela    ADD (attention deficit disorder) 04/09/2019   Menorrhagia with regular cycle 04/09/2019   Lesion of radial nerve 12/15/2018   Occipital neuralgia of left side 01/30/2018   Right lateral epicondylitis 11/19/2017   Past Medical History:  Diagnosis Date   ADHD (attention deficit hyperactivity disorder)    Anxiety    Asthma    years ago   Seasonal allergies     History reviewed. No pertinent family history.  Past Surgical History:  Procedure Laterality Date   ANKLE FUSION Left 12/07/2021   Procedure: LEFT LISFRANC FUSION;  Surgeon: Newt Minion, MD;  Location[facility];  Service: Orthopedics;  Laterality: Left;   FOOT SURGERY Left  bone spurs   Social History   Occupational History   Not on file  Tobacco Use   Smoking status: Former    Types: Cigarettes    Quit date: 10/2016    Years since quitting: 5.3   Smokeless tobacco: Never  Vaping Use   Vaping Use: Never used  Substance and Sexual Activity   Alcohol use: Not Currently   Drug use: Never   Sexual activity: Not on file

## 2022-02-20 NOTE — Therapy (Addendum)
OUTPATIENT PHYSICAL THERAPY TREATMENT/DISCHARGE NOTE   Patient Name: Brittney Alvarez MRN: [id number] DOB:[birth year], 51 y.o., female Today's Date: 02/20/2022  PCP: Jacqlyn Larsen, MD REFERRING PROVIDER: Newt Minion, MD  PHYSICAL THERAPY DISCHARGE SUMMARY  Visits from Start of Care: 6  Current functional level related to goals / functional outcomes: See note   Remaining deficits: See note   Education / Equipment: HEP   Patient agrees to discharge. Patient goals were partially met. Patient is being discharged due to not returning since the last visit.    END OF SESSION:   PT End of Session - 02/20/22 1058     Visit Number 6    Number of Visits 18    PT Start Time 0931    PT Stop Time 1013    PT Time Calculation (min) 42 min    Activity Tolerance Patient tolerated treatment well;No increased pain    Behavior During Therapy WFL for tasks assessed/performed                 Past Medical History:  Diagnosis Date   ADHD (attention deficit hyperactivity disorder)    Anxiety    Asthma    years ago   Seasonal allergies    Past Surgical History:  Procedure Laterality Date   ANKLE FUSION Left 12/07/2021   Procedure: LEFT LISFRANC FUSION;  Surgeon: Newt Minion, MD;  Location: [facility];  Service: Orthopedics;  Laterality: Left;   FOOT SURGERY Left    bone spurs   Patient Active Problem List   Diagnosis Date Noted   Lisfranc dislocation, left, sequela    ADD (attention deficit disorder) 04/09/2019   Menorrhagia with regular cycle 04/09/2019   Lesion of radial nerve 12/15/2018   Occipital neuralgia of left side 01/30/2018   Right lateral epicondylitis 11/19/2017    REFERRING DIAG: S93.325S (ICD-10-CM) - Lisfranc dislocation, left, sequela   THERAPY DIAG:  Difficulty in walking, not elsewhere classified  Muscle weakness (generalized)  Localized edema  Stiffness of left ankle, not elsewhere classified  Pain in left ankle and joints of left foot  Rationale  for Evaluation and Treatment Rehabilitation  PERTINENT HISTORY: ADD, asthma  PRECAUTIONS: None  SUBJECTIVE: Ivylynn notes pain has been pretty manageable.  Ibuprofen 2X/day.  Pain is worst after activity.  PAIN:  Are you having pain? Yes: NPRS scale: 0-4/10 Pain location: L foot Pain description: Ache, sharp Aggravating factors: Too much WB, very sensitive to the touch Relieving factors: Ibuprofen, heat   OBJECTIVE: (objective measures completed at initial evaluation unless otherwise dated)  OBJECTIVE:    DIAGNOSTIC FINDINGS: Healing post-surgery (see imaging)   PATIENT SURVEYS:  FOTO 26 (Goal 59 in 18 visits) 01/29/22: FOTO 50%   COGNITION:           Overall cognitive status: Within functional limits for tasks assessed                          SENSATION: Denies tingling, some paresthesias around the scar   EDEMA:  Present and not objectively assessed   LOWER EXTREMITY ROM:   Active/Passive ROM Right 01/10/2022 Left 01/10/2022  Left 01/29/22 Passive Left 02/11/2022 AROM L/R 02/20/2022 AROM  Hip flexion         Hip extension         Hip abduction         Hip adduction         Hip internal rotation  Hip external rotation         Knee flexion         Knee extension         Ankle dorsiflexion     10 0 in 0 deg knee extension 0 (neutral)  Ankle plantarflexion 1 -7 12  2   Ankle inversion     22    Ankle eversion     10     (Blank rows = not tested)   LOWER EXTREMITY MMT:  Deferred due to time post-surgery on eval   MMT  Right 01/29/22 Left 01/29/22 L/R in pounds 02/20/2022  Hip flexion 5/5  5/5    Hip extension       Hip abduction 5/5  5/5    Hip adduction 5/5  5/5    Hip internal rotation       Hip external rotation       Knee flexion  5/5 5/5    Knee extension  5/5  5/5   Ankle dorsiflexion 5/5  3/5    Ankle plantarflexion 5/5  3/5    Ankle inversion  5/5  3/5 30.2/42.0  Ankle eversion  5/5 3/5  32.9/49.8   (Blank rows = not tested)   GAIT:    Comments: Lucky is using a scooter but reports taking a few steps WBAT at home and in her office.  I encouraged her to bring a shoe so we can look at gait and do more WB activities on her next PT visit.       TODAY'S TREATMENT: 02/20/2022: - Theraband Ankle Inversion Blue  - 20 reps - 3 hold - Theraband Ankle Eversion Blue - 20 reps - 3 hold   - Heel cord slant board 3X 1 minute each (knees straight and bent) with slight toe in (knees straight)  - Heel raises (avoid rocking) 20X 3 seconds (up with both, down L only)  Neuromuscular re-education: Tandem balance eyes open; head turning; eyes closed 3X each 20 seconds and single leg stance eyes open 5X 10 seconds   02/11/2022: Therex:  DF on step x 10 c cues for home use  Green band x 15 inversion, eversion, PF, DF c cues for home use  Instruction for desensitization techniques c pillow case for neural sensitivity.  Cross friction massage education for incisional sensitivity and mobility improvements   Manual:  Light touch desensitization Lt dorsum of foot c pillow case 5 mins  Cross friction massage to incision   G3 Ap mobs Lt talocrural joint for DF mobility gains.    01/29/2022 TherEx:  Recumbent bike: Scifit: seat 10, Level 2 x 6 minutes Supine Quadricep Sets  - 20 reps - 5 second hold Sidelying Hip Abduction  -2 sets - 10 reps - 5 seconds hold Prone Hip Extension  - 2 sets - 10 reps - 3 seconds hold Theraband Ankle Inversion Red  - 20 reps - 3 hold Theraband Ankle Eversion Red  - 20 reps - 3 hold Heel cord slant board 1 minute each (knees straight and bent) Heel to toe raises x 20 Standing on Rocker Bocker board x 1 minute Neuromuscular Re-education:  SLS on Airex x 30 seconds with UE support as needed  Fitter First round board x 1 minute in parallel bars Rocker board x 1 minute c UE support       PATIENT EDUCATION:  02/11/2022 Education details: HEP update, desensitization techniques Person educated:  Patient Education method: Explanation, Demonstration, Tactile cues, Verbal cues, and  Handouts Education comprehension: verbalized understanding, returned demonstration, verbal cues required, tactile cues required, and needs further education     HOME EXERCISE PROGRAM: Access Code: [email or web address] URL: [email or web address] Date: 02/11/2022 Prepared by: Scot Jun  Exercises - Sidelying Hip Abduction  - 1-2 x daily - 7 x weekly - 2 sets - 10 reps - 5 seconds hold - Prone Hip Extension  - 1-2 x daily - 7 x weekly - 2 sets - 10 reps - 3 seconds hold - Slant Board Gastrocnemius Stretch  - 2-3 x daily - 7 x weekly - 1 sets - 4-5 reps - 60 hold - Heel Toe Raises with Counter Support  - 3-5 x daily - 7 x weekly - 1 sets - 10 reps - 3 seconds hold - Tandem Stance  - 2-3 x daily - 7 x weekly - 1 sets - 5 reps - 20 second hold - Slant Board Soleus Stretch  - 2-3 x daily - 7 x weekly - 1 sets - 4-5 reps - 60 hold - Long Sitting Ankle Inversion with Resistance  - 1-2 x daily - 7 x weekly - 3 sets - 10 reps - Long Sitting Ankle Eversion with Resistance  - 1-2 x daily - 7 x weekly - 3 sets - 10 reps - Long Sitting Ankle Dorsiflexion with Anchored Resistance  - 1-2 x daily - 7 x weekly - 3 sets - 10 reps - Long Sitting Ankle Plantar Flexion with Resistance  - 1-2 x daily - 7 x weekly - 3 sets - 10 reps - Standing Dorsiflexion Self-Mobilization on Step  - 2 x daily - 7 x weekly - 3 sets - 10 reps - 2-3 hold    ASSESSMENT:   CLINICAL IMPRESSION: Shavelle is happy with her post-surgical progress.  She does have soreness, fatigue and increased pain later in the day.  Strength activities should help this and this remains the focus of her HEP.  She was also encouraged to continue her heel cords stretching 2X/day as this is also shy of LTGs.  Tyniah was offered the opportunity for more independent rehabilitation, but chooses to continue supervised for another few visits.   OBJECTIVE IMPAIRMENTS  Abnormal gait, decreased activity tolerance, decreased balance, decreased endurance, decreased knowledge of condition, difficulty walking, decreased ROM, decreased strength, increased edema, impaired perceived functional ability, impaired flexibility, and pain.    ACTIVITY LIMITATIONS standing, squatting, stairs, and locomotion level   PARTICIPATION LIMITATIONS: cleaning, laundry, community activity, and yard work   PERSONAL FACTORS Time since onset of injury/illness/exacerbation are also affecting patient's functional outcome.    REHAB POTENTIAL: Good   CLINICAL DECISION MAKING: Stable/uncomplicated   EVALUATION COMPLEXITY: Low     GOALS: Goals reviewed with patient? Yes   SHORT TERM GOALS: Target date: 02/07/2022    Kodie will be independent with her day 1 HEP. Baseline: Started 01/10/2022 Goal status: Met 01/21/2022   2.  [place] will improve L ankle dorsiflexion to 5 degrees. Baseline: -7 degrees Goal status: MET 01/29/2022   3.  Reya will be able to stand on her L foot for 5-10 minutes at a time. Baseline: Poor WB endurance and mostly uses her scooter Goal status: MET (15-20 minutes) 02/20/2022     LONG TERM GOALS: Target date: 04/04/2022    Improve FOTO to 59. Baseline: 26 Goal status: Met - assessed 02/20/2022   2.  Carlicia will report Lt foot pain consistently 0-3/10 on the VAS. Baseline: 4-7/10 Goal status:  On Going - assessed  02/20/2022   3.  Improve Lt ankle dorsiflexion AROM to at least 10 degrees. Baseline: -7 degrees Goal status:  On going - assessed 02/20/2022   4.  Improve Lt ankle strength to 90% or greater as compared to the uninvolved R. Baseline: Deferred due to early post-surgery.  Will assess after her next set of X-Rays if appropriate. Goal status:  On going - assessed 02/20/2022   5.  Nalaysia will be able to stand > 30 minutes and walk up to a mile with a cane or lower AD at DC. Baseline: WBAT and uses a scooter most of the time. Goal status:  On going (20  minutes) - assessed 02/20/2022   6.  Rickell will be independent with her long-term DC HEP. Baseline: Started 01/10/2022 Goal status:  On going - assessed 02/20/2022     PLAN: PT FREQUENCY: 1-2x/week   PT DURATION: 12 weeks   PLANNED INTERVENTIONS: Therapeutic exercises, Therapeutic activity, Neuromuscular re-education, Balance training, Gait training, Patient/Family education, Joint mobilization, Stair training, Dry Needling, Cryotherapy, Compression bandaging, Vasopneumatic device, and Manual therapy   PLAN FOR NEXT SESSION: DF AROM, balance for intrinsics strength, calf and general LE strength.     Farley Ly, PT, MPT 02/20/22  11:04 AM

## 2022-02-26 ENCOUNTER — Encounter: Payer: BC Managed Care – PPO | Admitting: Rehabilitative and Restorative Service Providers"

## 2022-02-28 ENCOUNTER — Telehealth: Payer: Self-pay | Admitting: Rehabilitative and Restorative Service Providers"

## 2022-02-28 ENCOUNTER — Encounter: Payer: BC Managed Care – PPO | Admitting: Rehabilitative and Restorative Service Providers"

## 2022-02-28 NOTE — Telephone Encounter (Signed)
Left message with direct PT phone number for her to call and schedule a follow-up so I can send a progress note before her 7/24 appointment with Dr. Sharol Given

## 2022-03-11 ENCOUNTER — Ambulatory Visit (INDEPENDENT_AMBULATORY_CARE_PROVIDER_SITE_OTHER): Payer: BC Managed Care – PPO | Admitting: Orthopedic Surgery

## 2022-03-11 ENCOUNTER — Encounter: Payer: Self-pay | Admitting: Orthopedic Surgery

## 2022-03-11 DIAGNOSIS — S93325S Dislocation of tarsometatarsal joint of left foot, sequela: Secondary | ICD-10-CM

## 2022-03-11 NOTE — Progress Notes (Signed)
Office Visit Note   Patient: Brittney Alvarez           Date of Birth: 03/14/1971           MRN: [id number] Visit Date: 03/11/2022              Requested by: Angelina Sheriff, MD [address] [place],  [place] [address] PCP: Angelina Sheriff, MD  Chief Complaint  Patient presents with   Left Foot - Routine Post Op    12/07/2021 left foot Lisfranc fusion       HPI: Patient is a 51 year old woman status post Lisfranc fusion on the left who developed complex regional pain syndrome postoperatively.  Patient developed a sound sensitivity from the SSRI medication and this was stopped.  She is currently using Neurontin 3 times a day a total of 1200 mg.  Patient ambulates in flip-flops she still has some swelling and dependent redness.  Assessment & Plan: Visit Diagnoses:  1. Lisfranc dislocation, left, sequela     Plan: Continue to work on range of motion of the ankle she will wean off the Neurontin as she feels comfortable.   Follow-Up Instructions: Return if symptoms worsen or fail to improve.   Ortho Exam  Patient is alert, oriented, no adenopathy, well-dressed, normal affect, normal respiratory effort. Examination patient does have dependent redness there is no cellulitis the hypersensitivity to light touch has completely resolved.  She is improving the range of motion of her ankle.  No open wounds no drainage no temperature changes.   Imaging: No results found. No images are attached to the encounter.  Labs: No results found for: "HGBA1C", "ESRSEDRATE", "CRP", "LABURIC", "REPTSTATUS", "GRAMSTAIN", "CULT", "LABORGA"   No results found for: "ALBUMIN", "PREALBUMIN", "CBC"  No results found for: "MG" No results found for: "VD25OH"  No results found for: "PREALBUMIN"    Latest Ref Rng & Units 12/07/2021    8:53 AM  CBC EXTENDED  WBC 4.0 - 10.5 K/uL 3.5   RBC 3.87 - 5.11 MIL/uL 4.92   Hemoglobin 12.0 - 15.0 g/dL 13.5   HCT 36.0 - 46.0 % 41.1   Platelets 150 - 400  K/uL 216      There is no height or weight on file to calculate BMI.  Orders:  No orders of the defined types were placed in this encounter.  No orders of the defined types were placed in this encounter.    Procedures: No procedures performed  Clinical Data: No additional findings.  ROS:  All other systems negative, except as noted in the HPI. Review of Systems  Objective: Vital Signs: There were no vitals taken for this visit.  Specialty Comments:  No specialty comments available.  PMFS History: Patient Active Problem List   Diagnosis Date Noted   Lisfranc dislocation, left, sequela    ADD (attention deficit disorder) 04/09/2019   Menorrhagia with regular cycle 04/09/2019   Lesion of radial nerve 12/15/2018   Occipital neuralgia of left side 01/30/2018   Right lateral epicondylitis 11/19/2017   Past Medical History:  Diagnosis Date   ADHD (attention deficit hyperactivity disorder)    Anxiety    Asthma    years ago   Seasonal allergies     History reviewed. No pertinent family history.  Past Surgical History:  Procedure Laterality Date   ANKLE FUSION Left 12/07/2021   Procedure: LEFT LISFRANC FUSION;  Surgeon: Newt Minion, MD;  Location: [facility];  Service: Orthopedics;  Laterality: Left;  FOOT SURGERY Left    bone spurs   Social History   Occupational History   Not on file  Tobacco Use   Smoking status: Former    Types: Cigarettes    Quit date: 10/2016    Years since quitting: 5.4   Smokeless tobacco: Never  Vaping Use   Vaping Use: Never used  Substance and Sexual Activity   Alcohol use: Not Currently   Drug use: Never   Sexual activity: Not on file

## 2022-03-12 DIAGNOSIS — N898 Other specified noninflammatory disorders of vagina: Secondary | ICD-10-CM | POA: Diagnosis not present

## 2022-03-12 DIAGNOSIS — Z1211 Encounter for screening for malignant neoplasm of colon: Secondary | ICD-10-CM | POA: Diagnosis not present

## 2022-03-12 DIAGNOSIS — Z1231 Encounter for screening mammogram for malignant neoplasm of breast: Secondary | ICD-10-CM | POA: Diagnosis not present

## 2022-03-12 DIAGNOSIS — Z01419 Encounter for gynecological examination (general) (routine) without abnormal findings: Secondary | ICD-10-CM | POA: Diagnosis not present

## 2022-03-13 DIAGNOSIS — L298 Other pruritus: Secondary | ICD-10-CM | POA: Diagnosis not present

## 2022-03-13 DIAGNOSIS — L280 Lichen simplex chronicus: Secondary | ICD-10-CM | POA: Diagnosis not present

## 2022-03-13 DIAGNOSIS — L408 Other psoriasis: Secondary | ICD-10-CM | POA: Diagnosis not present

## 2022-03-13 DIAGNOSIS — L309 Dermatitis, unspecified: Secondary | ICD-10-CM | POA: Diagnosis not present

## 2022-03-25 ENCOUNTER — Other Ambulatory Visit: Payer: Self-pay | Admitting: Orthopedic Surgery

## 2022-04-03 ENCOUNTER — Other Ambulatory Visit: Payer: Self-pay | Admitting: Orthopedic Surgery

## 2022-04-10 DIAGNOSIS — Z3202 Encounter for pregnancy test, result negative: Secondary | ICD-10-CM | POA: Diagnosis not present

## 2022-04-10 DIAGNOSIS — Z3042 Encounter for surveillance of injectable contraceptive: Secondary | ICD-10-CM | POA: Diagnosis not present

## 2022-06-13 ENCOUNTER — Encounter: Payer: Self-pay | Admitting: Internal Medicine

## 2022-06-14 ENCOUNTER — Other Ambulatory Visit: Payer: Self-pay

## 2022-06-14 ENCOUNTER — Encounter: Payer: Self-pay | Admitting: Radiology

## 2022-06-14 ENCOUNTER — Emergency Department: Payer: BC Managed Care – PPO

## 2022-06-14 ENCOUNTER — Emergency Department
Admission: EM | Admit: 2022-06-14 | Discharge: 2022-06-14 | Disposition: A | Discharge status: Home / Self Care | Payer: BC Managed Care – PPO | Attending: Student in an Organized Health Care Education/Training Program | Admitting: Student in an Organized Health Care Education/Training Program

## 2022-06-14 DIAGNOSIS — R1013 Epigastric pain: Secondary | ICD-10-CM | POA: Insufficient documentation

## 2022-06-14 DIAGNOSIS — R197 Diarrhea, unspecified: Secondary | ICD-10-CM | POA: Diagnosis not present

## 2022-06-14 DIAGNOSIS — D259 Leiomyoma of uterus, unspecified: Secondary | ICD-10-CM | POA: Diagnosis not present

## 2022-06-14 DIAGNOSIS — E876 Hypokalemia: Secondary | ICD-10-CM | POA: Insufficient documentation

## 2022-06-14 LAB — COMPREHENSIVE METABOLIC PANEL
ALT: 14 U/L (ref 0–44)
AST: 15 U/L (ref 15–41)
Albumin: 4 g/dL (ref 3.5–5.0)
Alkaline Phosphatase: 70 U/L (ref 38–126)
Anion gap: 7 (ref 5–15)
BUN: 11 mg/dL (ref 6–20)
CO2: 27 mmol/L (ref 22–32)
Calcium: 9.1 mg/dL (ref 8.9–10.3)
Chloride: 103 mmol/L (ref 98–111)
Creatinine, Ser: 0.69 mg/dL (ref 0.44–1.00)
GFR, Estimated: >60 mL/min (ref >60)
Glucose, Bld: 94 mg/dL (ref 70–99)
Potassium: 2.6 mmol/L — CL (ref 3.5–5.1)
Sodium: 137 mmol/L (ref 135–145)
Total Bilirubin: 0.6 mg/dL (ref 0.3–1.2)
Total Protein: 8 g/dL (ref 6.5–8.1)

## 2022-06-14 LAB — CBC
HCT: 39.9 % (ref 36.0–46.0)
Hemoglobin: 13.5 g/dL (ref 12.0–15.0)
MCH: 26.8 pg (ref 26.0–34.0)
MCHC: 33.8 g/dL (ref 30.0–36.0)
MCV: 79.2 fL — ABNORMAL LOW (ref 80.0–100.0)
Platelets: 285 10*3/uL (ref 150–400)
RBC: 5.04 MIL/uL (ref 3.87–5.11)
RDW: 11.9 % (ref 11.5–15.5)
WBC: 7.8 10*3/uL (ref 4.0–10.5)
nRBC: 0 % (ref 0.0–0.2)

## 2022-06-14 LAB — URINALYSIS, ROUTINE W REFLEX MICROSCOPIC
Bacteria, UA: NONE SEEN
Bilirubin Urine: NEGATIVE
Glucose, UA: NEGATIVE mg/dL
Hgb urine dipstick: NEGATIVE
Ketones, ur: 5 mg/dL — AB
Leukocytes,Ua: NEGATIVE
Nitrite: NEGATIVE
Protein, ur: 30 mg/dL — AB
Specific Gravity, Urine: 1.027 (ref 1.005–1.030)
pH: 6 (ref 5.0–8.0)

## 2022-06-14 LAB — POC URINE PREG, ED: Preg Test, Ur: NEGATIVE

## 2022-06-14 LAB — LIPASE, BLOOD: Lipase: 37 U/L (ref 11–51)

## 2022-06-14 MED ORDER — ALUM & MAG HYDROXIDE-SIMETH 200-200-20 MG/5ML PO SUSP
30.0000 mL | Freq: Once | ORAL | Status: AC
  Administered 2022-06-14: 30 mL via ORAL
  Filled 2022-06-14: qty 30

## 2022-06-14 MED ORDER — POTASSIUM CHLORIDE 20 MEQ PO PACK
40.0000 meq | PACK | Freq: Two times a day (BID) | ORAL | Status: DC
Start: 2022-06-14 — End: 2022-06-15
  Administered 2022-06-14: 40 meq via ORAL
  Filled 2022-06-14: qty 2

## 2022-06-14 MED ORDER — IOHEXOL 300 MG/ML  SOLN
100.0000 mL | Freq: Once | INTRAMUSCULAR | Status: AC | PRN
Start: 2022-06-14 — End: 2022-06-14
  Administered 2022-06-14: 100 mL via INTRAVENOUS

## 2022-06-14 MED ORDER — OMEPRAZOLE MAGNESIUM 20 MG PO TBEC: 20.0000 mg | DELAYED_RELEASE_TABLET | Freq: Every day | ORAL | 0 refills | Status: DC

## 2022-06-14 MED ORDER — POTASSIUM CHLORIDE 10 MEQ/100ML IV SOLN
10.0000 meq | Freq: Once | INTRAVENOUS | Status: AC
  Administered 2022-06-14: 10 meq via INTRAVENOUS
  Filled 2022-06-14: qty 100

## 2022-06-14 MED ORDER — FAMOTIDINE 20 MG PO TABS: 20.0000 mg | ORAL_TABLET | Freq: Two times a day (BID) | ORAL | 0 refills | Status: DC

## 2022-06-14 NOTE — Discharge Instructions (Signed)
Your blood work revealed hypokalemia, and this was repleted in the emergency department.  This is likely due to the diarrhea you were having.  Your other blood work was normal.  Your CT scan of your abdomen pelvis did not demonstrate any acute findings to explain your pain.  The location of your pain, and the worsening with gastric irritants is suspicious for gastritis.  Please take the medications as prescribed.  Please follow-up with your gastroenterologist as you have scheduled.  I recommend that you decrease caffeine, spicy foods, acidic foods, and other gastric irritants as we discussed.  Return for any new, worsening, or change in symptoms or other concerns.  It was a pleasure caring for you today.

## 2022-06-14 NOTE — ED Provider Notes (Signed)
[facility] Provider Note    Event Date/Time   First MD Initiated Contact with Patient 06/14/22 1742     (approximate)   History   Abdominal Pain   HPI  Brittney Alvarez is a 51 y.o. female who presents today for evaluation of abdominal pain that began 5 days ago.  Patient reports that her pain is worse anytime she tries to eat.  Specifically, she reports that she drinks a lot of [facility] every day, as well as tomatoes.  She noticed that her pain began after eating a lot of tomatoes originally.  She describes the pain as a burning sensation.  She reports that it spreads across her abdomen.  She denies any nausea or vomiting.  She has reported that she has had diarrhea recently.  No blood in her stool.  No black stool.  No fevers or chills.  No urinary symptoms.  Patient Active Problem List   Diagnosis Date Noted   Lisfranc dislocation, left, sequela    ADD (attention deficit disorder) 04/09/2019   Menorrhagia with regular cycle 04/09/2019   Lesion of radial nerve 12/15/2018   Occipital neuralgia of left side 01/30/2018   Right lateral epicondylitis 11/19/2017          Physical Exam   Triage Vital Signs: ED Triage Vitals  Enc Vitals Group     BP 06/14/22 1644 117/84     Pulse Rate 06/14/22 1644 (!) 102     Resp 06/14/22 1644 18     Temp 06/14/22 1647 98.6 F (37 C)     Temp Source 06/14/22 1644 Oral     SpO2 06/14/22 1644 98 %     Weight 06/14/22 1644 192 lb 14.4 oz (87.5 kg)     Height 06/14/22 1644 '5\' 11"'$  (1.803 m)     Head Circumference --      Peak Flow --      Pain Score 06/14/22 1644 4     Pain Loc --      Pain Edu? --      Excl. in [place]? --     Most recent vital signs: Vitals:   06/14/22 1647 06/14/22 1943  BP:  115/62  Pulse:  83  Resp:  16  Temp: 98.6 F (37 C) 98.3 F (36.8 C)  SpO2:  97%    Physical Exam Vitals and nursing note reviewed.  Constitutional:      General: Awake and alert. No acute distress.     Appearance: Normal appearance. The patient is normal weight.  HENT:     Head: Normocephalic and atraumatic.     Mouth: Mucous membranes are moist.  Eyes:     General: PERRL. Normal EOMs        Right eye: No discharge.        Left eye: No discharge.     Conjunctiva/sclera: Conjunctivae normal.  Cardiovascular:     Rate and Rhythm: Normal rate and regular rhythm.     Pulses: Normal pulses.     Heart sounds: Normal heart sounds Pulmonary:     Effort: Pulmonary effort is normal. No respiratory distress.     Breath sounds: Normal breath sounds.  Abdominal:     Abdomen is soft. No rebound or guarding. No distention.  Mild epigastric tenderness.  Negative Murphy sign.  Right lower quadrant tenderness, negative Rovsing's, psoas, and obturator signs. Musculoskeletal:        General: No swelling. Normal range of motion.  Cervical back: Normal range of motion and neck supple.  Skin:    General: Skin is warm and dry.     Capillary Refill: Capillary refill takes less than 2 seconds.     Findings: No rash.  Neurological:     Mental Status: The patient is awake and alert.      ED Results / Procedures / Treatments   Labs (all labs ordered are listed, but only abnormal results are displayed) Labs Reviewed  COMPREHENSIVE METABOLIC PANEL - Abnormal; Notable for the following components:      Result Value   Potassium 2.6 (*)    All other components within normal limits  CBC - Abnormal; Notable for the following components:   MCV 79.2 (*)    All other components within normal limits  URINALYSIS, ROUTINE W REFLEX MICROSCOPIC - Abnormal; Notable for the following components:   Color, Urine YELLOW (*)    APPearance HAZY (*)    Ketones, ur 5 (*)    Protein, ur 30 (*)    All other components within normal limits  LIPASE, BLOOD  POC URINE PREG, ED     EKG     RADIOLOGY I independently reviewed and interpreted imaging and agree with radiologists  findings.     PROCEDURES:  Critical Care performed:   Procedures   MEDICATIONS ORDERED IN ED: Medications  potassium chloride (KLOR-CON) packet 40 mEq (40 mEq Oral Given 06/14/22 2002)  potassium chloride 10 mEq in 100 mL IVPB (0 mEq Intravenous Stopped 06/14/22 1916)  iohexol (OMNIPAQUE) 300 MG/ML solution 100 mL (100 mLs Intravenous Contrast Given 06/14/22 1822)  alum & mag hydroxide-simeth (MAALOX/MYLANTA) 200-200-20 MG/5ML suspension 30 mL (30 mLs Oral Given 06/14/22 1852)  potassium chloride 10 mEq in 100 mL IVPB (0 mEq Intravenous Stopped 06/14/22 2003)     IMPRESSION / MDM / ASSESSMENT AND PLAN / ED COURSE  I reviewed the triage vital signs and the nursing notes.   Differential diagnosis includes, but is not limited to, gastritis, peptic ulcer disease, biliary colic, cholecystitis, appendicitis, diverticulitis, pancreatitis.  Patient is awake and alert, hemodynamically stable and afebrile.  Blood tests are overall reassuring, with the exception of hypokalemia, likely in the setting of her recent diarrhea.  This was repleted in the emergency department.  No EKG changes were noted.  Given her upper quadrant and lower quadrant tenderness, CT scan was obtained.  This demonstrates no gallstones, pericholecystic fluid, dilated common bile duct, evidence of appendicitis, or other intra-abdominal abnormalities.  Patient was treated symptomatically with GI cocktail which improved her symptoms significantly.  We discussed her diet in detail, and her consumption of large quantities of [facility] is likely a major gastric irritants for her. She also noticed that her pain worsened after tomatoes, which is also another gastric irritant.  She was started on omeprazole and H2 blocker.  She has a follow-up appointment with her gastroenterologist on Tuesday already scheduled.  I recommended that she keep this appointment, as well as modify her diet as we discussed.  We discussed strict return  precautions in the meantime.  Patient understands and agrees with plan.  She was discharged in stable condition.   Patient's presentation is most consistent with acute complicated illness / injury requiring diagnostic workup.   Clinical Course as of 06/14/22 2254  Fri Jun 14, 2022  1944 Patient reports that she feels improved after the GI cocktail [JP]    Clinical Course User Index [JP] Oviya Ammar, Clarnce Flock, PA-C  FINAL CLINICAL IMPRESSION(S) / ED DIAGNOSES   Final diagnoses:  Hypokalemia  Epigastric abdominal pain     Rx / DC Orders   ED Discharge Orders          Ordered    omeprazole (PRILOSEC OTC) 20 MG tablet  Daily        06/14/22 1951    famotidine (PEPCID) 20 MG tablet  2 times daily        06/14/22 1951             Note:  This document was prepared using Dragon voice recognition software and may include unintentional dictation errors.   Emeline Gins 06/14/22 2254    Merlyn Lot, MD 06/16/22 [phone]

## 2022-06-14 NOTE — ED Notes (Signed)
Critical result relayed to Sheran Luz, PA. Potassium 2.6

## 2022-06-14 NOTE — ED Triage Notes (Signed)
Pt here with abd pain since Sunday. Pt states that whenever she eats, the pain becomes severe. Pt states pain is centered and sometimes radiates across her abd. Pt having nausea and diarrhea but denies vomiting.

## 2022-06-14 NOTE — ED Notes (Signed)
Pt received discharge papers and verbalized understanding of discharge instructions. IV removed. Pt ambulates to exit with steady gait, accompanied by family member.

## 2022-06-18 ENCOUNTER — Encounter: Payer: Self-pay | Admitting: Internal Medicine

## 2022-06-18 ENCOUNTER — Ambulatory Visit (INDEPENDENT_AMBULATORY_CARE_PROVIDER_SITE_OTHER): Payer: BC Managed Care – PPO | Admitting: Internal Medicine

## 2022-06-18 VITALS — BP 122/82 | HR 107 | Ht 71.0 in | Wt 198.0 lb

## 2022-06-18 DIAGNOSIS — R109 Unspecified abdominal pain: Secondary | ICD-10-CM | POA: Diagnosis not present

## 2022-06-18 DIAGNOSIS — R197 Diarrhea, unspecified: Secondary | ICD-10-CM

## 2022-06-18 DIAGNOSIS — E876 Hypokalemia: Secondary | ICD-10-CM

## 2022-06-18 DIAGNOSIS — R1013 Epigastric pain: Secondary | ICD-10-CM

## 2022-06-18 MED ORDER — PANTOPRAZOLE SODIUM 40 MG PO TBEC: 40.0000 mg | DELAYED_RELEASE_TABLET | Freq: Two times a day (BID) | ORAL | 3 refills | Status: DC

## 2022-06-18 NOTE — Patient Instructions (Signed)
_______________________________________________________  If you are age 49 or older, your body mass index should be between 23-30. Your Body mass index is 27.62 kg/m. If this is out of the aforementioned range listed, please consider follow up with your Primary Care Provider.  If you are age 13 or younger, your body mass index should be between 19-25. Your Body mass index is 27.62 kg/m. If this is out of the aformentioned range listed, please consider follow up with your Primary Care Provider.   ________________________________________________________  The [place] [facility] providers would like to encourage you to use [facility] to communicate with providers for non-urgent requests or questions.  Due to long hold times on the telephone, sending your provider a message by [facility] may be a faster and more efficient way to get a response.  Please allow 48 business hours for a response.  Please remember that this is for non-urgent requests.  _______________________________________________________  Brittney Alvarez have been scheduled for an abdominal ultrasound at [facility]iology (1st floor of hospital) on 06/21/2022 at 4:30pm. Please arrive 30 minutes prior to your appointment for registration. Make certain not to have anything to eat or drink 6 hours prior to your appointment. Should you need to reschedule your appointment, please contact radiology at [phone]. This test typically takes about 30 minutes to perform.  You have been scheduled for an endoscopy. Please follow written instructions given to you at your visit today. If you use inhalers (even only as needed), please bring them with you on the day of your procedure.

## 2022-06-18 NOTE — Progress Notes (Signed)
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  She is new to this practice.  She reports being in her usual state of health until about 10 days ago when she developed severe epigastric pain.  This was associated with diarrhea.  Problem persisted.  She was evaluated in the emergency room 1 week later on June 14, 2022.  Blood work revealed hypokalemia with a potassium of 2.6.  She was repleted.  Comprehensive metabolic panel otherwise normal.  CBC unremarkable with normal [place] blood cell count and hemoglobin 13.5.  She did undergo a contrast-enhanced CT scan of the abdomen and pelvis which revealed no acute abnormalities.  The appendix was normal.  The pancreas was normal.  No abnormalities of the gallbladder.  The colon was normal.  She was treated with over-the-counter Prilosec and famotidine.  She tells me that she has continued with discomfort and loose stools.  Eating exacerbates symptoms.  She can only tolerate scrambled eggs at this time.  No prior GI history though she does report having had screening colonoscopy with Dr. Earlean Shawl about 4 to 5 years ago.  Records have been requested.  No bleeding.  She had been on regular doses of ibuprofen for about 6 months due to foot pain.  Has not been on them, however, for about a month.  No vomiting.  REVIEW OF SYSTEMS:  All non-GI ROS negative except for  Past Medical History:  Diagnosis Date   ADHD (attention deficit hyperactivity disorder)    Anxiety    Asthma    years ago   Seasonal allergies     Past Surgical History:  Procedure Laterality Date   ANKLE FUSION Left 12/07/2021   Procedure: LEFT LISFRANC FUSION;  Surgeon: Newt Minion, MD;  Location: [facility];  Service: Orthopedics;  Laterality: Left;   FOOT SURGERY Left    bone spurs    Social History SAGAN WURZEL  reports that she quit smoking about 5 years ago. Her smoking use included cigarettes. She  has never used smokeless tobacco. She reports that she does not currently use alcohol. She reports that she does not use drugs.  family history includes Diabetes in her father; Hypertension in her mother.  Allergies  Allergen Reactions   Other     Had surgery on 04/27/19 - pt stated, "had a reaction to sutures"       PHYSICAL EXAMINATION: Vital signs: BP 122/82   Pulse (!) 107   Ht '5\' 11"'$  (1.803 m)   Wt 198 lb (89.8 kg)   LMP 06/04/2022 (Exact Date)   BMI 27.62 kg/m   Constitutional: generally well-appearing, no acute distress Psychiatric: alert and oriented x3, cooperative Eyes: extraocular movements intact, anicteric, conjunctiva pink Mouth: oral pharynx moist, no lesions Neck: supple no lymphadenopathy Cardiovascular: heart regular rate and rhythm, no murmur Lungs: clear to auscultation bilaterally Abdomen: soft, nontender, nondistended, no obvious ascites, no peritoneal signs, normal bowel sounds, no organomegaly Rectal: Omitted Extremities: no clubbing, cyanosis, or lower extremity edema bilaterally Skin: no lesions on visible extremities Neuro: No focal deficits.  Cranial nerves intact  ASSESSMENT:  1.  10-day history of postprandial epigastric pain and loose stools.  ER evaluation is noted.  Hypokalemia.  Suspect viral gastroenteritis.  Rule out ulcer disease.  Rule out gallbladder disease. 2.  Colonoscopy elsewhere 4 to 5 years ago  PLAN:  1.  Prescribe pantoprazole 40 mg p.o. twice daily. 2.  Stop Prilosec and famotidine 3.  Schedule abdominal ultrasound to rule out gallstones 4.  Schedule diagnostic upper endoscopy.The nature of the procedure, as well as the risks, benefits, and alternatives were carefully and thoroughly reviewed with the patient. Ample time for discussion and questions allowed. The patient understood, was satisfied, and agreed to proceed.  5.  Okay to use low-dose Imodium for loose stools. 6.  Further plans after the above completed 7.  Obtain  outside colonoscopy report for review and incorporation into her record as her gastroenterologist has retired A total time of 60 minutes was spent preparing to see the patient, reviewing ER evaluation, reviewing laboratories, reviewing x-rays, obtaining comprehensive history, performing medically appropriate physical examination, ordering medication, ordering advanced radiology study, ordering endoscopy.  Also, counseling and educating the patient regarding the above listed issues and documenting clinical information in the health record.

## 2022-06-19 ENCOUNTER — Other Ambulatory Visit: Payer: Self-pay | Admitting: Orthopedic Surgery

## 2022-06-19 ENCOUNTER — Ambulatory Visit (AMBULATORY_SURGERY_CENTER): Payer: BC Managed Care – PPO | Admitting: Internal Medicine

## 2022-06-19 ENCOUNTER — Encounter: Payer: Self-pay | Admitting: Internal Medicine

## 2022-06-19 VITALS — BP 121/74 | HR 77 | Temp 97.5°F | Resp 16 | Ht 71.0 in | Wt 198.0 lb

## 2022-06-19 DIAGNOSIS — R197 Diarrhea, unspecified: Secondary | ICD-10-CM

## 2022-06-19 DIAGNOSIS — R109 Unspecified abdominal pain: Secondary | ICD-10-CM

## 2022-06-19 DIAGNOSIS — R1013 Epigastric pain: Secondary | ICD-10-CM | POA: Diagnosis not present

## 2022-06-19 MED ORDER — SODIUM CHLORIDE 0.9 % IV SOLN: 500.0000 mL | Freq: Once | INTRAVENOUS | Status: DC

## 2022-06-19 NOTE — Progress Notes (Unsigned)
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  She is new to this practice.  She reports being in her usual state of health until about 10 days ago when she developed severe epigastric pain.  This was associated with diarrhea.  Problem persisted.  She was evaluated in the emergency room 1 week later on June 14, 2022.  Blood work revealed hypokalemia with a potassium of 2.6.  She was repleted.  Comprehensive metabolic panel otherwise normal.  CBC unremarkable with normal [place] blood cell count and hemoglobin 13.5.  She did undergo a contrast-enhanced CT scan of the abdomen and pelvis which revealed no acute abnormalities.  The appendix was normal.  The pancreas was normal.  No abnormalities of the gallbladder.  The colon was normal.  She was treated with over-the-counter Prilosec and famotidine.  She tells me that she has continued with discomfort and loose stools.  Eating exacerbates symptoms.  She can only tolerate scrambled eggs at this time.  No prior GI history though she does report having had screening colonoscopy with Dr. Earlean Shawl about 4 to 5 years ago.  Records have been requested.  No bleeding.  She had been on regular doses of ibuprofen for about 6 months due to foot pain.  Has not been on them, however, for about a month.  No vomiting.   REVIEW OF SYSTEMS:   All non-GI ROS negative except for       Past Medical History:  Diagnosis Date   ADHD (attention deficit hyperactivity disorder)     Anxiety     Asthma      years ago   Seasonal allergies             Past Surgical History:  Procedure Laterality Date   ANKLE FUSION Left 12/07/2021    Procedure: LEFT LISFRANC FUSION;  Surgeon: Newt Minion, MD;  Location: [facility];  Service: Orthopedics;  Laterality: Left;   FOOT SURGERY Left      bone spurs      Social History CYNTHYA YAM  reports that she quit smoking about 5 years ago. Her smoking use  included cigarettes. She has never used smokeless tobacco. She reports that she does not currently use alcohol. She reports that she does not use drugs.   family history includes Diabetes in her father; Hypertension in her mother.        Allergies  Allergen Reactions   Other        Had surgery on 04/27/19 - pt stated, "had a reaction to sutures"          PHYSICAL EXAMINATION: Vital signs: BP 122/82   Pulse (!) 107   Ht '5\' 11"'$  (1.803 m)   Wt 198 lb (89.8 kg)   LMP 06/04/2022 (Exact Date)   BMI 27.62 kg/m   Constitutional: generally well-appearing, no acute distress Psychiatric: alert and oriented x3, cooperative Eyes: extraocular movements intact, anicteric, conjunctiva pink Mouth: oral pharynx moist, no lesions Neck: supple no lymphadenopathy Cardiovascular: heart regular rate and rhythm, no murmur Lungs: clear to auscultation bilaterally Abdomen: soft, nontender, nondistended, no obvious ascites, no peritoneal signs, normal bowel sounds, no organomegaly Rectal: Omitted Extremities: no clubbing, cyanosis, or lower extremity edema bilaterally Skin: no lesions on visible extremities Neuro: No focal deficits.  Cranial nerves intact   ASSESSMENT:   1.  10-day history of postprandial epigastric pain and loose stools.  ER evaluation is  noted.  Hypokalemia.  Suspect viral gastroenteritis.  Rule out ulcer disease.  Rule out gallbladder disease. 2.  Colonoscopy elsewhere 4 to 5 years ago   PLAN:   1.  Prescribe pantoprazole 40 mg p.o. twice daily. 2.  Stop Prilosec and famotidine 3.  Schedule abdominal ultrasound to rule out gallstones 4.  Schedule diagnostic upper endoscopy.The nature of the procedure, as well as the risks, benefits, and alternatives were carefully and thoroughly reviewed with the patient. Ample time for discussion and questions allowed. The patient understood, was satisfied, and agreed to proceed.  5.  Okay to use low-dose Imodium for loose stools. 6.  Further  plans after the above completed 7.  Obtain outside colonoscopy report for review and incorporation into her record as her gastroenterologist has retired

## 2022-06-19 NOTE — Progress Notes (Unsigned)
Sedate, gd SR, tolerated procedure well, VSS, report to RN 

## 2022-06-19 NOTE — Patient Instructions (Signed)
1. Patient has a contact number available for emergencies. The signs and symptoms of potential delayed complications were discussed with the patient. Return to normal activities tomorrow. Written discharge instructions were provided to the patient. 2. Resume previous diet. 3. Continue present medications. 4. Keep plans for abdominal ultrasound as scheduled. We will contact you after the results are available 5. Okay not to take recently prescribed pantoprazole given the findings on endoscopy today 6. Recommending IBgard. Take one 15 minutes before meals. This will help calm the stomach. You can pick this up over-the-counter  YOU HAD AN ENDOSCOPIC PROCEDURE TODAY AT [facility]:   Refer to the procedure report that was given to you for any specific questions about what was found during the examination.  If the procedure report does not answer your questions, please call your gastroenterologist to clarify.  If you requested that your care partner not be given the details of your procedure findings, then the procedure report has been included in a sealed envelope for you to review at your convenience later.  YOU SHOULD EXPECT: Some feelings of bloating in the abdomen. Passage of more gas than usual.  Walking can help get rid of the air that was put into your GI tract during the procedure and reduce the bloating. If you had a lower endoscopy (such as a colonoscopy or flexible sigmoidoscopy) you may notice spotting of blood in your stool or on the toilet paper. If you underwent a bowel prep for your procedure, you may not have a normal bowel movement for a few days.  Please Note:  You might notice some irritation and congestion in your nose or some drainage.  This is from the oxygen used during your procedure.  There is no need for concern and it should clear up in a day or so.  SYMPTOMS TO REPORT IMMEDIATELY:  Following upper endoscopy (EGD)  Vomiting of blood or coffee ground  material  New chest pain or pain under the shoulder blades  Painful or persistently difficult swallowing  New shortness of breath  Fever of 100F or higher  Black, tarry-looking stools  For urgent or emergent issues, a gastroenterologist can be reached at any hour by calling [phone]. Do not use MyChart messaging for urgent concerns.    DIET:  We do recommend a small meal at first, but then you may proceed to your regular diet.  Drink plenty of fluids but you should avoid alcoholic beverages for 24 hours.  ACTIVITY:  You should plan to take it easy for the rest of today and you should NOT DRIVE or use heavy machinery until tomorrow (because of the sedation medicines used during the test).    FOLLOW UP: Our staff will call the number listed on your records the next business day following your procedure.  We will call around 7:15- 8:00 am to check on you and address any questions or concerns that you may have regarding the information given to you following your procedure. If we do not reach you, we will leave a message.     If any biopsies were taken you will be contacted by phone or by letter within the next 1-3 weeks.  Please call us at [phone] if you have not heard about the biopsies in 3 weeks.    SIGNATURES/CONFIDENTIALITY: You and/or your care partner have signed paperwork which will be entered into your electronic medical record.  These signatures attest to the fact that that the information above on  your After Visit Summary has been reviewed and is understood.  Full responsibility of the confidentiality of this discharge information lies with you and/or your care-partner.

## 2022-06-19 NOTE — Op Note (Signed)
[facility] Patient Name: Brittney Alvarez Procedure Date: 06/19/2022 3:42 PM MRN: [id number] Endoscopist: Docia Chuck. Henrene Pastor , MD, [id number] Age: 51 Referring MD:  Date of Birth: October 23, 1970 Gender: Female Account #: [id number] Procedure:                Upper GI endoscopy Indications:              Epigastric abdominal pain Medicines:                Monitored Anesthesia Care Procedure:                Pre-Anesthesia Assessment:                           - Prior to the procedure, a History and Physical                            was performed, and patient medications and                            allergies were reviewed. The patient's tolerance of                            previous anesthesia was also reviewed. The risks                            and benefits of the procedure and the sedation                            options and risks were discussed with the patient.                            All questions were answered, and informed consent                            was obtained. Prior Anticoagulants: The patient has                            taken no anticoagulant or antiplatelet agents. ASA                            Grade Assessment: II - A patient with mild systemic                            disease. After reviewing the risks and benefits,                            the patient was deemed in satisfactory condition to                            undergo the procedure.                           After obtaining informed consent, the endoscope was  passed under direct vision. Throughout the                            procedure, the patient's blood pressure, pulse, and                            oxygen saturations were monitored continuously. The                            Endoscope was introduced through the mouth, and                            advanced to the second part of duodenum. The upper                            GI endoscopy was accomplished  without difficulty.                            The patient tolerated the procedure well. Scope In: Scope Out: Findings:                 The esophagus was normal.                           The stomach was normal.                           The examined duodenum was normal.                           The cardia and gastric fundus were normal on                            retroflexion. Complications:            No immediate complications. Estimated Blood Loss:     Estimated blood loss: none. Impression:               1. Normal EGD                           2. No cause for pain found on upper endoscopy Recommendation:           1. Patient has a contact number available for                            emergencies. The signs and symptoms of potential                            delayed complications were discussed with the                            patient. Return to normal activities tomorrow.                            Written discharge instructions were provided to the  patient.                           2. Resume previous diet.                           3. Continue present medications.                           4. Keep plans for abdominal ultrasound as                            scheduled. We will contact you after the results                            are available                           5. Okay not to take recently prescribed                            pantoprazole given the findings on endoscopy today                           6. Recommending IBgard. Take one 15 minutes before                            meals. This will help calm the stomach. You can                            pick this up over-the-counter Brittney Alvarez N. Henrene Pastor, MD 06/19/2022 4:13:16 PM This report has been signed electronically.

## 2022-06-20 NOTE — Telephone Encounter (Signed)
Attempted to reach patient for post-procedure f/u call. No answer. Left message for her to please not hesitate to call if she has any questions/concerns regarding her care. 

## 2022-06-21 ENCOUNTER — Ambulatory Visit (HOSPITAL_COMMUNITY)
Admission: RE | Admit: 2022-06-21 | Discharge: 2022-06-21 | Disposition: A | Discharge status: Home / Self Care | Payer: BC Managed Care – PPO | Source: Ambulatory Visit | Attending: Internal Medicine | Admitting: Internal Medicine

## 2022-06-21 DIAGNOSIS — R109 Unspecified abdominal pain: Secondary | ICD-10-CM | POA: Diagnosis not present

## 2022-06-21 DIAGNOSIS — R197 Diarrhea, unspecified: Secondary | ICD-10-CM | POA: Insufficient documentation

## 2022-06-28 DIAGNOSIS — L814 Other melanin hyperpigmentation: Secondary | ICD-10-CM | POA: Diagnosis not present

## 2022-06-28 DIAGNOSIS — L280 Lichen simplex chronicus: Secondary | ICD-10-CM | POA: Diagnosis not present

## 2022-06-28 DIAGNOSIS — L821 Other seborrheic keratosis: Secondary | ICD-10-CM | POA: Diagnosis not present

## 2022-06-28 DIAGNOSIS — D225 Melanocytic nevi of trunk: Secondary | ICD-10-CM | POA: Diagnosis not present

## 2022-07-09 ENCOUNTER — Encounter: Payer: Self-pay | Admitting: Internal Medicine

## 2022-07-16 DIAGNOSIS — L280 Lichen simplex chronicus: Secondary | ICD-10-CM | POA: Diagnosis not present

## 2022-07-16 DIAGNOSIS — L309 Dermatitis, unspecified: Secondary | ICD-10-CM | POA: Diagnosis not present

## 2022-07-31 ENCOUNTER — Encounter: Payer: Self-pay | Admitting: Internal Medicine

## 2022-07-31 ENCOUNTER — Ambulatory Visit (INDEPENDENT_AMBULATORY_CARE_PROVIDER_SITE_OTHER): Payer: BC Managed Care – PPO | Admitting: Internal Medicine

## 2022-07-31 VITALS — BP 122/78 | HR 94 | Ht 70.0 in | Wt 196.4 lb

## 2022-07-31 DIAGNOSIS — R109 Unspecified abdominal pain: Secondary | ICD-10-CM

## 2022-07-31 DIAGNOSIS — R197 Diarrhea, unspecified: Secondary | ICD-10-CM | POA: Diagnosis not present

## 2022-07-31 MED ORDER — METRONIDAZOLE 250 MG PO TABS: 250.0000 mg | ORAL_TABLET | Freq: Three times a day (TID) | ORAL | 0 refills | Status: DC

## 2022-07-31 MED ORDER — PLENVU 140 G PO SOLR: 1.0000 | Freq: Once | ORAL | 0 refills | Status: AC

## 2022-07-31 NOTE — Patient Instructions (Signed)
_______________________________________________________  If you are age 49 or older, your body mass index should be between 23-30. Your Body mass index is 28.18 kg/m. If this is out of the aforementioned range listed, please consider follow up with your Primary Care Provider.  If you are age 71 or younger, your body mass index should be between 19-25. Your Body mass index is 28.18 kg/m. If this is out of the aformentioned range listed, please consider follow up with your Primary Care Provider.   ________________________________________________________  The [facility] providers would like to encourage you to use [facility] to communicate with providers for non-urgent requests or questions.  Due to long hold times on the telephone, sending your provider a message by [facility] may be a faster and more efficient way to get a response.  Please allow 48 business hours for a response.  Please remember that this is for non-urgent requests.  _______________________________________________________  We have sent the following medications to your pharmacy for you to pick up at your convenience:  Flagyl  You may use Imodium as needed  You have been scheduled for a colonoscopy. Please follow written instructions given to you at your visit today.  Please pick up your prep supplies at the pharmacy within the next 1-3 days. If you use inhalers (even only as needed), please bring them with you on the day of your procedure.

## 2022-07-31 NOTE — Progress Notes (Signed)
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  See that dictation for details.  She was placed on pantoprazole, scheduled for abdominal ultrasound, and set up for upper endoscopy.  Ultrasound was unremarkable (as was CT scan October 2023).  Upper endoscopy performed June 19, 2022 was normal.  She was told to continue pantoprazole once daily and initiate IBgard before meals.  Follow-up at this time arranged.  Patient tells me that she has had some improvement in her abdominal pain.  She continues on pantoprazole IBgard.  She is able to eat better.  Weight is stable.  She continues to have problems with diarrhea.  Her baseline is 2 bowel movements per day which may be somewhat loose but not diarrhea.  Currently describes urgent watery bowel movements approximately 6 times per day.  No other new complaints.Marland Kitchen  REVIEW OF SYSTEMS:  All non-GI ROS negative as otherwise stated in the HPI except for allergies, anxiety, night sweats, headaches, skin rash, sleeping problems  Past Medical History:  Diagnosis Date   ADHD (attention deficit hyperactivity disorder)    Anxiety    Asthma    years ago   Seasonal allergies     Past Surgical History:  Procedure Laterality Date   ANKLE FUSION Left 12/07/2021   Procedure: LEFT LISFRANC FUSION;  Surgeon: Newt Minion, MD;  Location: [facility];  Service: Orthopedics;  Laterality: Left;   FOOT SURGERY Left    bone spurs    Social History Brittney Alvarez  reports that she quit smoking about 5 years ago. Her smoking use included cigarettes. She has never used smokeless tobacco. She reports that she does not currently use alcohol. She reports that she does not use drugs.  family history includes Diabetes in her father; Hypertension in her mother.  Allergies  Allergen Reactions   Other     Had surgery on 04/27/19 - pt stated, "had a reaction to  sutures"       PHYSICAL EXAMINATION: Vital signs: BP 122/78   Pulse 94   Ht '5\' 10"'$  (1.778 m)   Wt 196 lb 6.4 oz (89.1 kg)   SpO2 96%   BMI 28.18 kg/m   Constitutional: generally well-appearing, no acute distress Psychiatric: alert and oriented x3, cooperative Eyes: extraocular movements intact, anicteric, conjunctiva pink Mouth: oral pharynx moist, no lesions Neck: supple no lymphadenopathy Cardiovascular: heart regular rate and rhythm, no murmur Lungs: clear to auscultation bilaterally Abdomen: soft, nontender, nondistended, no obvious ascites, no peritoneal signs, normal bowel sounds, no organomegaly Rectal: Deferred to colonoscopy Extremities: no clubbing, cyanosis, or lower extremity edema bilaterally Skin: no lesions on visible extremities save tattoos Neuro: No focal deficits.  Cranial nerves intact  ASSESSMENT:  1.  Problems with postprandial upper abdominal pain.  Improved.  Currently on pantoprazole and IBgard 2.  Problems with diarrhea.  Ongoing.   PLAN:  1.  Continue pantoprazole IBgard 2.  Okay to use Imodium for diarrhea 3.  Prescribe metronidazole 250 mg p.o. 3 times daily to treat possible infectious etiology such as Giardia.  No alcohol while taking this medication.  She understands. 4.  Schedule colonoscopy with biopsies.  Rule out microscopic colitis.The nature of the procedure, as well as the risks, benefits, and alternatives were carefully and thoroughly reviewed with the patient. Ample time for discussion and questions allowed. The patient understood, was satisfied, and agreed to proceed. A total  time of 30 minutes was spent preparing to see the patient, reviewing data, obtaining interval history, performing medically appropriate physical examination, counseling and educating patient regarding the above listed issues, ordering medication, ordering endoscopic procedure, and documenting clinical information in the health record

## 2022-08-28 DIAGNOSIS — F900 Attention-deficit hyperactivity disorder, predominantly inattentive type: Secondary | ICD-10-CM | POA: Diagnosis not present

## 2022-08-28 DIAGNOSIS — F411 Generalized anxiety disorder: Secondary | ICD-10-CM | POA: Diagnosis not present

## 2022-08-28 DIAGNOSIS — F331 Major depressive disorder, recurrent, moderate: Secondary | ICD-10-CM | POA: Diagnosis not present

## 2022-09-12 ENCOUNTER — Encounter: Payer: Self-pay | Admitting: Internal Medicine

## 2022-09-20 ENCOUNTER — Ambulatory Visit (AMBULATORY_SURGERY_CENTER): Payer: BC Managed Care – PPO | Admitting: Internal Medicine

## 2022-09-20 ENCOUNTER — Encounter: Payer: Self-pay | Admitting: Internal Medicine

## 2022-09-20 VITALS — BP 111/68 | HR 60 | Temp 96.9°F | Resp 13 | Ht 70.0 in | Wt 196.0 lb

## 2022-09-20 DIAGNOSIS — K6389 Other specified diseases of intestine: Secondary | ICD-10-CM | POA: Diagnosis not present

## 2022-09-20 DIAGNOSIS — D128 Benign neoplasm of rectum: Secondary | ICD-10-CM | POA: Diagnosis not present

## 2022-09-20 DIAGNOSIS — R197 Diarrhea, unspecified: Secondary | ICD-10-CM

## 2022-09-20 DIAGNOSIS — R109 Unspecified abdominal pain: Secondary | ICD-10-CM | POA: Diagnosis not present

## 2022-09-20 DIAGNOSIS — D120 Benign neoplasm of cecum: Secondary | ICD-10-CM | POA: Diagnosis not present

## 2022-09-20 DIAGNOSIS — K51514 Left sided colitis with abscess: Secondary | ICD-10-CM | POA: Diagnosis not present

## 2022-09-20 DIAGNOSIS — K6289 Other specified diseases of anus and rectum: Secondary | ICD-10-CM | POA: Diagnosis not present

## 2022-09-20 HISTORY — PX: COLONOSCOPY: SHX174

## 2022-09-20 MED ORDER — SODIUM CHLORIDE 0.9 % IV SOLN: 500.0000 mL | Freq: Once | INTRAVENOUS | Status: DC

## 2022-09-20 MED ORDER — SODIUM CHLORIDE 0.9 % IV SOLN
4.0000 mg | Freq: Once | INTRAVENOUS | Status: AC
  Administered 2022-09-20: 4 mg via INTRAVENOUS

## 2022-09-20 NOTE — Op Note (Signed)
[facility] Patient Name: Brittney Alvarez Procedure Date: 09/20/2022 10:40 AM MRN: [id number] Endoscopist: Docia Chuck. Henrene Pastor , MD, [id number] Age: 52 Referring MD:  Date of Birth: [birth year] Gender: Female Account #: [id number] Procedure:                Colonoscopy with cold snare polypectomy x 2; with                            biopsies Indications:              Abdominal pain, Clinically significant diarrhea of                            unexplained origin Medicines:                Monitored Anesthesia Care Procedure:                Pre-Anesthesia Assessment:                           - Prior to the procedure, a History and Physical                            was performed, and patient medications and                            allergies were reviewed. The patient's tolerance of                            previous anesthesia was also reviewed. The risks                            and benefits of the procedure and the sedation                            options and risks were discussed with the patient.                            All questions were answered, and informed consent                            was obtained. Prior Anticoagulants: The patient has                            taken no anticoagulant or antiplatelet agents. ASA                            Grade Assessment: II - A patient with mild systemic                            disease. After reviewing the risks and benefits,                            the patient was deemed in satisfactory condition to  undergo the procedure.                           After obtaining informed consent, the colonoscope                            was passed under direct vision. Throughout the                            procedure, the patient's blood pressure, pulse, and                            oxygen saturations were monitored continuously. The                            CF [id number] #[id number] was introduced through  the anus                            and advanced to the the cecum, identified by                            appendiceal orifice and ileocecal valve. The                            terminal ileum, ileocecal valve, appendiceal                            orifice, and rectum were photographed. The quality                            of the bowel preparation was excellent. The                            colonoscopy was performed without difficulty. The                            patient tolerated the procedure well. The bowel                            preparation used was SUPREP via split dose                            instruction. Scope In: 10:55:44 AM Scope Out: 11:16:00 AM Scope Withdrawal Time: 0 hours 14 minutes 12 seconds  Total Procedure Duration: 0 hours 20 minutes 16 seconds  Findings:                 The terminal ileum appeared normal.                           Two polyps were found in the rectum and cecum. The                            polyps were 2 to 4 mm in size. These polyps were  removed with a cold snare. Resection and retrieval                            were complete.                           The colonic mucosa in the distal third of the colon                            had darkening changes consistent with melanosis                            coli. Exam was otherwise without abnormality on                            direct and retroflexion views.                           Biopsies for histology were taken with a cold                            forceps from the entire colon for evaluation of                            microscopic colitis. Complications:            No immediate complications. Estimated blood loss:                            None. Estimated Blood Loss:     Estimated blood loss: none. Impression:               - The examined portion of the ileum was normal.                            Melanosis coli.                           -  Two 2 to 4 mm polyps in the rectum and in the                            cecum, removed with a cold snare. Resected and                            retrieved.                           - The examination was otherwise normal on direct                            and retroflexion views.                           - Biopsies were taken with a cold forceps from the  entire colon for evaluation of microscopic colitis. Recommendation:           - Repeat colonoscopy in 7-10 years for surveillance.                           - Patient has a contact number available for                            emergencies. The signs and symptoms of potential                            delayed complications were discussed with the                            patient. Return to normal activities tomorrow.                            Written discharge instructions were provided to the                            patient.                           - Resume previous diet.                           - Continue present medications.                           - Await pathology results. We will contact you with                            the results and additional recommendations                            including follow-up.                           - Imodium as needed for diarrhea Docia Chuck. Henrene Pastor, MD 09/20/2022 11:29:30 AM This report has been signed electronically.

## 2022-09-20 NOTE — Progress Notes (Signed)
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  See that dictation for details.  She was placed on pantoprazole, scheduled for abdominal ultrasound, and set up for upper endoscopy.  Ultrasound was unremarkable (as was CT scan October 2023).  Upper endoscopy performed June 19, 2022 was normal.  She was told to continue pantoprazole once daily and initiate IBgard before meals.  Follow-up at this time arranged.   Patient tells me that she has had some improvement in her abdominal pain.  She continues on pantoprazole IBgard.  She is able to eat better.  Weight is stable.  She continues to have problems with diarrhea.  Her baseline is 2 bowel movements per day which may be somewhat loose but not diarrhea.  Currently describes urgent watery bowel movements approximately 6 times per day.  No other new complaints.Marland Kitchen   REVIEW OF SYSTEMS:   All non-GI ROS negative as otherwise stated in the HPI except for allergies, anxiety, night sweats, headaches, skin rash, sleeping problems       Past Medical History:  Diagnosis Date   ADHD (attention deficit hyperactivity disorder)     Anxiety     Asthma      years ago   Seasonal allergies             Past Surgical History:  Procedure Laterality Date   ANKLE FUSION Left 12/07/2021    Procedure: LEFT LISFRANC FUSION;  Surgeon: Newt Minion, MD;  Location: [facility];  Service: Orthopedics;  Laterality: Left;   FOOT SURGERY Left      bone spurs      Social History Brittney Alvarez  reports that she quit smoking about 5 years ago. Her smoking use included cigarettes. She has never used smokeless tobacco. She reports that she does not currently use alcohol. She reports that she does not use drugs.   family history includes Diabetes in her father; Hypertension in her mother.        Allergies  Allergen Reactions   Other        Had surgery on  04/27/19 - pt stated, "had a reaction to sutures"          PHYSICAL EXAMINATION: Vital signs: BP 122/78   Pulse 94   Ht '5\' 10"'$  (1.778 m)   Wt 196 lb 6.4 oz (89.1 kg)   SpO2 96%   BMI 28.18 kg/m   Constitutional: generally well-appearing, no acute distress Psychiatric: alert and oriented x3, cooperative Eyes: extraocular movements intact, anicteric, conjunctiva pink Mouth: oral pharynx moist, no lesions Neck: supple no lymphadenopathy Cardiovascular: heart regular rate and rhythm, no murmur Lungs: clear to auscultation bilaterally Abdomen: soft, nontender, nondistended, no obvious ascites, no peritoneal signs, normal bowel sounds, no organomegaly Rectal: Deferred to colonoscopy Extremities: no clubbing, cyanosis, or lower extremity edema bilaterally Skin: no lesions on visible extremities save tattoos Neuro: No focal deficits.  Cranial nerves intact   ASSESSMENT:   1.  Problems with postprandial upper abdominal pain.  Improved.  Currently on pantoprazole and IBgard 2.  Problems with diarrhea.  Ongoing.     PLAN:   1.  Continue pantoprazole IBgard 2.  Okay to use Imodium for diarrhea 3.  Prescribe metronidazole 250 mg p.o. 3 times daily to treat possible infectious etiology such as Giardia.  No alcohol while taking this medication.  She understands. 4.  Schedule colonoscopy with  biopsies.  Rule out microscopic colitis.The nature of the procedure, as well as the risks, benefits, and alternatives were carefully and thoroughly reviewed with the patient. Ample time for discussion and questions allowed. The patient understood, was satisfied, and agreed to proceed.

## 2022-09-20 NOTE — Progress Notes (Signed)
A and O x3. Report to RN. Tolerated MAC anesthesia well. 

## 2022-09-20 NOTE — Progress Notes (Signed)
Pt's states no medical or surgical changes since previsit or office visit. 

## 2022-09-20 NOTE — Patient Instructions (Addendum)
Resume all of your current medications.  Read all of the handouts given to you by your recovery room nurse.  Imodium is fine to use per Dr Henrene Pastor.  YOU HAD AN ENDOSCOPIC PROCEDURE TODAY AT [facility]:   Refer to the procedure report that was given to you for any specific questions about what was found during the examination.  If the procedure report does not answer your questions, please call your gastroenterologist to clarify.  If you requested that your care partner not be given the details of your procedure findings, then the procedure report has been included in a sealed envelope for you to review at your convenience later.  YOU SHOULD EXPECT: Some feelings of bloating in the abdomen. Passage of more gas than usual.  Walking can help get rid of the air that was put into your GI tract during the procedure and reduce the bloating. If you had a lower endoscopy (such as a colonoscopy or flexible sigmoidoscopy) you may notice spotting of blood in your stool or on the toilet paper. If you underwent a bowel prep for your procedure, you may not have a normal bowel movement for a few days.  Please Note:  You might notice some irritation and congestion in your nose or some drainage.  This is from the oxygen used during your procedure.  There is no need for concern and it should clear up in a day or so.  SYMPTOMS TO REPORT IMMEDIATELY:  Following lower endoscopy (colonoscopy or flexible sigmoidoscopy):  Excessive amounts of blood in the stool  Significant tenderness or worsening of abdominal pains  Swelling of the abdomen that is new, acute  Fever of 100F or higher   For urgent or emergent issues, a gastroenterologist can be reached at any hour by calling [phone]. Do not use MyChart messaging for urgent concerns.    DIET:  We do recommend a small meal at first, but then you may proceed to your regular diet.  Drink plenty of fluids but you should avoid alcoholic beverages for 24  hours.  ACTIVITY:  You should plan to take it easy for the rest of today and you should NOT DRIVE or use heavy machinery until tomorrow (because of the sedation medicines used during the test).    FOLLOW UP: Our staff will call the number listed on your records the next business day following your procedure.  We will call around 7:15- 8:00 am to check on you and address any questions or concerns that you may have regarding the information given to you following your procedure. If we do not reach you, we will leave a message.     If any biopsies were taken you will be contacted by phone or by letter within the next 1-3 weeks.  Please call us at [phone] if you have not heard about the biopsies in 3 weeks.    SIGNATURES/CONFIDENTIALITY: You and/or your care partner have signed paperwork which will be entered into your electronic medical record.  These signatures attest to the fact that that the information above on your After Visit Summary has been reviewed and is understood.  Full responsibility of the confidentiality of this discharge information lies with you and/or your care-partner.

## 2022-09-20 NOTE — Progress Notes (Signed)
Called to room to assist during endoscopic procedure.  Patient ID and intended procedure confirmed with present staff. Received instructions for my participation in the procedure from the performing physician.  

## 2022-09-23 ENCOUNTER — Telehealth: Payer: Self-pay | Admitting: *Deleted

## 2022-09-23 NOTE — Telephone Encounter (Signed)
Post procedure follow up phone call. No answer at number given.  Left message on voicemail.  

## 2022-09-24 ENCOUNTER — Other Ambulatory Visit: Payer: Self-pay

## 2022-09-24 ENCOUNTER — Encounter: Payer: Self-pay | Admitting: Internal Medicine

## 2022-09-24 MED ORDER — BUDESONIDE 3 MG PO CPEP: 9.0000 mg | ORAL_CAPSULE | Freq: Every day | ORAL | 1 refills | Status: DC

## 2022-09-26 DIAGNOSIS — H10412 Chronic giant papillary conjunctivitis, left eye: Secondary | ICD-10-CM | POA: Diagnosis not present

## 2022-09-26 DIAGNOSIS — H5213 Myopia, bilateral: Secondary | ICD-10-CM | POA: Diagnosis not present

## 2022-10-03 DIAGNOSIS — L42 Pityriasis rosea: Secondary | ICD-10-CM | POA: Diagnosis not present

## 2022-10-14 DIAGNOSIS — L209 Atopic dermatitis, unspecified: Secondary | ICD-10-CM | POA: Diagnosis not present

## 2022-10-14 DIAGNOSIS — J301 Allergic rhinitis due to pollen: Secondary | ICD-10-CM | POA: Diagnosis not present

## 2022-10-14 DIAGNOSIS — J305 Allergic rhinitis due to food: Secondary | ICD-10-CM | POA: Diagnosis not present

## 2022-10-14 DIAGNOSIS — J309 Allergic rhinitis, unspecified: Secondary | ICD-10-CM | POA: Diagnosis not present

## 2022-10-17 ENCOUNTER — Encounter: Payer: Self-pay | Admitting: Radiology

## 2022-10-23 DIAGNOSIS — F3342 Major depressive disorder, recurrent, in full remission: Secondary | ICD-10-CM | POA: Diagnosis not present

## 2022-10-23 DIAGNOSIS — F411 Generalized anxiety disorder: Secondary | ICD-10-CM | POA: Diagnosis not present

## 2022-10-23 DIAGNOSIS — F900 Attention-deficit hyperactivity disorder, predominantly inattentive type: Secondary | ICD-10-CM | POA: Diagnosis not present

## 2022-10-29 ENCOUNTER — Encounter: Payer: Self-pay | Admitting: Internal Medicine

## 2022-10-29 ENCOUNTER — Ambulatory Visit (INDEPENDENT_AMBULATORY_CARE_PROVIDER_SITE_OTHER): Payer: BC Managed Care – PPO | Admitting: Internal Medicine

## 2022-10-29 VITALS — BP 108/72 | HR 109 | Ht 70.0 in | Wt 182.0 lb

## 2022-10-29 DIAGNOSIS — D120 Benign neoplasm of cecum: Secondary | ICD-10-CM

## 2022-10-29 DIAGNOSIS — K52839 Microscopic colitis, unspecified: Secondary | ICD-10-CM

## 2022-10-29 MED ORDER — BUDESONIDE 3 MG PO CPEP: 9.0000 mg | ORAL_CAPSULE | Freq: Every day | ORAL | 3 refills | Status: DC

## 2022-10-29 NOTE — Progress Notes (Signed)
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  See that dictation for details.  She was placed on pantoprazole, scheduled for abdominal ultrasound, and set up for upper endoscopy.  Ultrasound was unremarkable (as was CT scan October 2023).  Upper endoscopy performed June 19, 2022 was normal.  She was told to continue pantoprazole once daily and initiate IBgard before meals.  She followed up July 31, 2022 and reported that she has had some improvement in her abdominal pain.  She continued on pantoprazole and IBgard.  She was able to eat better.  Weight remained stable.  However, she continued to have significant problems with diarrhea.  Her baseline is 2 bowel movements per day which may be somewhat loose but not diarrhea.  At that time she describes urgent watery bowel movements approximately 6 times per day.  No other new complaints.  She was prescribed an empiric course of metronidazole, instructed to use Imodium, and set up for colonoscopy.  Time of her colonoscopy on September 20, 2022, she reported no improvement in diarrhea despite metronidazole.  Colonoscopy revealed a normal ileum.  2 diminutive polyps which were removed, 1 of which was an adenoma.  Random colon biopsies revealed colonic mucosa with increased intraepithelial lymphocytes consistent with microscopic colitis.  She does prescribe budesonide 9 mg daily and follows up at this time.  Patient states that her bowel habits have significantly improved, but not at baseline.  She now describes 4-5 soft bowel movements with less urgency.  Not using Imodium.  No other GI complaints.  She does have a rash for which she is seeing a dermatologist.  She has had this in the past prior to her recent starting of budesonide.  REVIEW OF SYSTEMS:  All non-GI ROS negative unless otherwise stated in the HPI except for pain,  headaches, depression, rash  Past Medical History:  Diagnosis Date   ADHD (attention deficit hyperactivity disorder)    Anxiety    Asthma    years ago   Seasonal allergies     Past Surgical History:  Procedure Laterality Date   ANKLE FUSION Left 12/07/2021   Procedure: LEFT LISFRANC FUSION;  Surgeon: Newt Minion, MD;  Location: [facility];  Service: Orthopedics;  Laterality: Left;   COLONOSCOPY  09/20/2022   FOOT SURGERY Left    bone spurs    Social History ERCELLE CONTESSA  reports that she quit smoking about 6 years ago. Her smoking use included cigarettes. She has never used smokeless tobacco. She reports that she does not currently use alcohol. She reports that she does not use drugs.  family history includes Diabetes in her father; Hypertension in her mother.  No Known Allergies     PHYSICAL EXAMINATION: Vital signs: BP 108/72   Pulse (!) 109   Ht '5\' 10"'$  (1.778 m)   Wt 182 lb (82.6 kg)   BMI 26.11 kg/m  General: Well-developed, well-nourished, no acute distress HEENT: Sclerae are anicteric,. Abdomen: Not reexamined. Extremities: Petechial rash Psychiatric: alert and oriented x3. Cooperative   ASSESSMENT:  1.  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  Adenomatous colon polyp on colonoscopy  PLAN:  1.  Discussion on microscopic colitis and how it might cause diarrhea 2.  Continue budesonide 9 mg daily throughout this month of March then begin taper.  She will decrease to 6 mg daily throughout the month of  April and 3 mg daily throughout the month of May.  Then stop. 3.  GI office follow-up 3 months.  She knows to contact the office in the interim for any questions or problems 4.  Still okay to use Imodium on demand, if needed. 5.  Surveillance colonoscopy around February 2031 A total time of 30 minutes spent.  See the patient, reviewing data, obtaining interval comprehensive history, performing medically appropriate physical examination, counseling and  educating the patient regarding above listed issues, and documenting clinical information in the health record

## 2022-10-29 NOTE — Patient Instructions (Signed)
_______________________________________________________  If your blood pressure at your visit was 140/90 or greater, please contact your primary care physician to follow up on this.  _______________________________________________________  If you are age 50 or older, your body mass index should be between 23-30. Your Body mass index is 26.11 kg/m. If this is out of the aforementioned range listed, please consider follow up with your Primary Care Provider.  If you are age 74 or younger, your body mass index should be between 19-25. Your Body mass index is 26.11 kg/m. If this is out of the aformentioned range listed, please consider follow up with your Primary Care Provider.   ________________________________________________________  [facility] providers would like to encourage you to use [facility] to communicate with providers for non-urgent requests or questions.  Due to long hold times on the telephone, sending your provider a message by [facility] may be a faster and more efficient way to get a response.  Please allow 48 business hours for a response.  Please remember that this is for non-urgent requests.  _______________________________________________________  We have sent the following medications to your pharmacy for you to pick up at your convenience:  Budesonide  Take '9mg'$  for the rest of March; take '6mg'$  for April; take '3mg'$  for May

## 2022-11-01 DIAGNOSIS — J301 Allergic rhinitis due to pollen: Secondary | ICD-10-CM | POA: Diagnosis not present

## 2022-11-07 DIAGNOSIS — J301 Allergic rhinitis due to pollen: Secondary | ICD-10-CM | POA: Diagnosis not present

## 2022-11-11 DIAGNOSIS — J301 Allergic rhinitis due to pollen: Secondary | ICD-10-CM | POA: Diagnosis not present

## 2022-11-11 DIAGNOSIS — L209 Atopic dermatitis, unspecified: Secondary | ICD-10-CM | POA: Diagnosis not present

## 2022-11-14 DIAGNOSIS — J301 Allergic rhinitis due to pollen: Secondary | ICD-10-CM | POA: Diagnosis not present

## 2022-11-18 DIAGNOSIS — J301 Allergic rhinitis due to pollen: Secondary | ICD-10-CM | POA: Diagnosis not present

## 2022-11-19 DIAGNOSIS — J301 Allergic rhinitis due to pollen: Secondary | ICD-10-CM | POA: Diagnosis not present

## 2022-11-21 DIAGNOSIS — J301 Allergic rhinitis due to pollen: Secondary | ICD-10-CM | POA: Diagnosis not present

## 2022-11-25 DIAGNOSIS — J301 Allergic rhinitis due to pollen: Secondary | ICD-10-CM | POA: Diagnosis not present

## 2022-11-28 DIAGNOSIS — J301 Allergic rhinitis due to pollen: Secondary | ICD-10-CM | POA: Diagnosis not present

## 2022-12-09 DIAGNOSIS — J301 Allergic rhinitis due to pollen: Secondary | ICD-10-CM | POA: Diagnosis not present

## 2022-12-12 DIAGNOSIS — J301 Allergic rhinitis due to pollen: Secondary | ICD-10-CM | POA: Diagnosis not present

## 2022-12-16 DIAGNOSIS — J301 Allergic rhinitis due to pollen: Secondary | ICD-10-CM | POA: Diagnosis not present

## 2022-12-17 DIAGNOSIS — D485 Neoplasm of uncertain behavior of skin: Secondary | ICD-10-CM | POA: Diagnosis not present

## 2022-12-17 DIAGNOSIS — J301 Allergic rhinitis due to pollen: Secondary | ICD-10-CM | POA: Diagnosis not present

## 2022-12-17 DIAGNOSIS — L538 Other specified erythematous conditions: Secondary | ICD-10-CM | POA: Diagnosis not present

## 2022-12-17 DIAGNOSIS — L570 Actinic keratosis: Secondary | ICD-10-CM | POA: Diagnosis not present

## 2022-12-23 DIAGNOSIS — J301 Allergic rhinitis due to pollen: Secondary | ICD-10-CM | POA: Diagnosis not present

## 2022-12-26 DIAGNOSIS — J301 Allergic rhinitis due to pollen: Secondary | ICD-10-CM | POA: Diagnosis not present

## 2022-12-30 DIAGNOSIS — J301 Allergic rhinitis due to pollen: Secondary | ICD-10-CM | POA: Diagnosis not present

## 2023-01-06 DIAGNOSIS — J301 Allergic rhinitis due to pollen: Secondary | ICD-10-CM | POA: Diagnosis not present

## 2023-01-13 DIAGNOSIS — K52839 Microscopic colitis, unspecified: Secondary | ICD-10-CM | POA: Insufficient documentation

## 2023-01-13 DIAGNOSIS — E7800 Pure hypercholesterolemia, unspecified: Secondary | ICD-10-CM | POA: Insufficient documentation

## 2023-01-13 NOTE — Patient Instructions (Addendum)
Schedule nurse visit when ready for Shingles vaccine and Tetanus vaccine.  Schedule your mammogram.   Living With Attention Deficit Hyperactivity Disorder If you have been diagnosed with attention deficit hyperactivity disorder (ADHD), you may be relieved that you now know why you have felt or behaved a certain way. Still, you may feel overwhelmed about the treatment ahead. You may also wonder how to get the support you need and how to deal with the condition day-to-day. With treatment and support, you can live with ADHD and manage your symptoms. How to manage lifestyle changes Managing lifestyle changes can be challenging. Seeking support from your healthcare provider, therapist, family, and friends can be helpful. How to recognize changes in your condition The following signs may mean that your treatment is working well and your condition is improving: Consistently being on time for appointments. Being more organized at home and work. Other people noticing improvements in your behavior. Achieving goals that you set for yourself. Thinking more clearly. The following signs may mean that your treatment is not working very well: Feeling impatience or more confusion. Missing, forgetting, or being late for appointments. An increasing sense of disorganization and messiness. More difficulty in reaching goals that you set for yourself. Loved ones becoming angry or frustrated with you. Follow these instructions at home: Medicines Take over-the-counter and prescription medicines only as told by your health care provider. Check with your health care provider before taking any new medicines. General instructions Create structure and an organized atmosphere at home. For example: Make a list of tasks, then rank them from most important to least important. Work on one task at a time until your listed tasks are done. Make a daily schedule and follow it consistently every day. Use an appointment  calendar, and check it 2-3 times a day to keep on track. Keep it with you when you leave the house. Create spaces where you keep certain things, and always put things back in their places after you use them. Keep all follow-up visits. Your health care provider will need to monitor your condition and adjust your treatment over time. Where to find support Talking to others  Keep emotion out of important discussions and speak in a calm, logical way. Listen closely and patiently to your loved ones. Try to understand their point of view, and try to avoid getting defensive. Take responsibility for the consequences of your actions. Ask that others do not take your behaviors personally. Aim to solve problems as they come up, and express your feelings instead of bottling them up. Talk openly about what you need from your loved ones and how they can support you. Consider going to family therapy sessions or having your family meet with a specialist who deals with ADHD-related behavior problems. Finances Not all insurance plans cover mental health care, so it is important to check with your insurance carrier. If paying for co-pays or counseling services is a problem, search for a local or county mental health care center. Public mental health care services may be offered there at a low cost or no cost when you are not able to see a private health care provider. If you are taking medicine for ADHD, you may be able to get the generic form, which may be less expensive than brand-name medicine. Some makers of prescription medicines also offer help to patients who cannot afford the medicines that they need. Therapy and support groups Talking with a mental health care provider and participating in support groups can  help to improve your quality of life, daily functioning, and overall symptoms. Questions to ask your health care provider: What are the risks and benefits of taking medicines? Would I benefit from  therapy? How often should I follow up with a health care provider? Where to find more information Learn more about ADHD from: Children and Adults with Attention Deficit Hyperactivity Disorder: [employer] of Mental Health: BloggerCourse.com Centers for Disease Control and Prevention: TonerPromos.no Contact a health care provider if: You have side effects from your medicines, such as: Repeated muscle twitches, coughing, or speech outbursts. Sleep problems. Loss of appetite. Dizziness. Unusually fast heartbeat. Stomach pains. Headaches. You have new or worsening behavior problems. You are struggling with anxiety, depression, or substance abuse. Get help right away if: You have a severe reaction to a medicine. These symptoms may be an emergency. Get help right away. Call 911. Do not wait to see if the symptoms will go away. Do not drive yourself to the hospital. Take one of these steps if you feel like you may hurt yourself or others, or have thoughts about taking your own life: Go to your nearest emergency room. Call 911. Call the National Suicide Prevention Lifeline at 681-293-3006 or 988. This is open 24 hours a day. Text the Crisis Text Line at 2170104058. Summary With treatment and support, you can live with ADHD and manage your symptoms. Consider taking part in family therapy or self-help groups with family members or friends. When you talk with friends and family about your ADHD, be patient and communicate openly. Keep all follow-up visits. Your health care provider will need to monitor your condition and adjust your treatment over time. This information is not intended to replace advice given to you by your health care provider. Make sure you discuss any questions you have with your health care provider. Document Revised: 11/23/2021 Document Reviewed: 11/23/2021 Elsevier Patient Education  2024 [employer].

## 2023-01-16 DIAGNOSIS — J301 Allergic rhinitis due to pollen: Secondary | ICD-10-CM | POA: Diagnosis not present

## 2023-01-20 DIAGNOSIS — J301 Allergic rhinitis due to pollen: Secondary | ICD-10-CM | POA: Diagnosis not present

## 2023-01-23 ENCOUNTER — Ambulatory Visit (INDEPENDENT_AMBULATORY_CARE_PROVIDER_SITE_OTHER): Payer: BC Managed Care – PPO | Admitting: Nurse Practitioner

## 2023-01-23 ENCOUNTER — Encounter: Payer: Self-pay | Admitting: Nurse Practitioner

## 2023-01-23 VITALS — BP 131/81 | HR 98 | Temp 98.0°F | Ht 70.0 in | Wt 185.0 lb

## 2023-01-23 DIAGNOSIS — Z114 Encounter for screening for human immunodeficiency virus [HIV]: Secondary | ICD-10-CM

## 2023-01-23 DIAGNOSIS — F988 Other specified behavioral and emotional disorders with onset usually occurring in childhood and adolescence: Secondary | ICD-10-CM | POA: Diagnosis not present

## 2023-01-23 DIAGNOSIS — E559 Vitamin D deficiency, unspecified: Secondary | ICD-10-CM | POA: Diagnosis not present

## 2023-01-23 DIAGNOSIS — E7800 Pure hypercholesterolemia, unspecified: Secondary | ICD-10-CM | POA: Diagnosis not present

## 2023-01-23 DIAGNOSIS — F418 Other specified anxiety disorders: Secondary | ICD-10-CM | POA: Diagnosis not present

## 2023-01-23 DIAGNOSIS — K52838 Other microscopic colitis: Secondary | ICD-10-CM

## 2023-01-23 DIAGNOSIS — J301 Allergic rhinitis due to pollen: Secondary | ICD-10-CM | POA: Diagnosis not present

## 2023-01-23 DIAGNOSIS — Z7689 Persons encountering health services in other specified circumstances: Secondary | ICD-10-CM

## 2023-01-23 DIAGNOSIS — Z1231 Encounter for screening mammogram for malignant neoplasm of breast: Secondary | ICD-10-CM

## 2023-01-23 DIAGNOSIS — Z9109 Other allergy status, other than to drugs and biological substances: Secondary | ICD-10-CM

## 2023-01-23 DIAGNOSIS — Z1159 Encounter for screening for other viral diseases: Secondary | ICD-10-CM

## 2023-01-23 NOTE — Progress Notes (Signed)
New Patient Office Visit  Subjective    Patient ID: Brittney Alvarez, female    DOB: [birth year]  Age: 52 y.o. MRN: [id number]  CC:  Chief Complaint  Patient presents with   New Patient (Initial Visit)    Here to establish care and would like for you to take over adderall, and diazepam    HPI Brittney Alvarez presents for new patient visit to establish care.  Introduced to Publishing rights manager role and practice setting.  All questions answered.  Discussed provider/patient relationship and expectations. Was being followed by Dr. Jeanie Sewer in [place] -- wishes to move clinics as lives in [place].    Follows with Dr. Feliberto Gottron -- last visit 03/12/22.  Gets frequent BV infections, currently taking Folic Acid as needed.  Has not had a menstrual cycle in 6 months.    Currently is seeing allergist and receiving allergy shots, every 2 weeks at present, working towards maintenance.  Takes Allegra daily for allergies.  Allergic to dogs, cats, all grasses, milk, cheese, malt.  Was followed by GI, last visit 10/29/22, treated for microscopic colitis.  Overall feeling better.    ADHD & ANXIETY Was diagnosed with this about 5 years ago. Follows with Dr. Evelene Croon in [place] for this, psychiatry -- has been with her for 5 years.  They had discussed Vyvanse, but this was too costly. Has not done therapy time.  Visits with Dr. Evelene Croon every 6 months.  Has been using Valium more often recently due to work stressors -- works for [employer] in [place], recently Musician and more stressors with this.  In past she took Lexapro for mood, but this took sexual drive away. ADHD status: stable Satisfied with current therapy: yes Medication compliance:  good compliance Controlled substance contract: yes with psychiatry Previous psychiatry evaluation: yes Previous medications: yes adderall Taking meds on weekends/vacations: no Work/school performance:  good Difficulty sustaining attention/completing tasks:  no Distracted by extraneous stimuli: no Does not listen when spoken to: no  Fidgets with hands or feet: no Unable to stay in seat: no Blurts out/interrupts others: no ADHD Medication Side Effects: no    Decreased appetite: no    Headache: no    Sleeping disturbance pattern: takes Melatonin    Irritability: yes    Rebound effects (worse than baseline) off medication: no    Anxiousness: no    Dizziness: no    Tics: no     01/23/2023    8:20 AM  Depression screen PHQ 2/9  Decreased Interest 1  Down, Depressed, Hopeless 1  PHQ - 2 Score 2  Altered sleeping 1  Tired, decreased energy 1  Change in appetite 1  Feeling bad or failure about yourself  0  Trouble concentrating 1  Moving slowly or fidgety/restless 0  Suicidal thoughts 0  PHQ-9 Score 6  Difficult doing work/chores Somewhat difficult       01/23/2023    8:20 AM  GAD 7 : Generalized Anxiety Score  Nervous, Anxious, on Edge 1  Control/stop worrying 1  Worry too much - different things 1  Trouble relaxing 0  Restless 0  Easily annoyed or irritable 2  Afraid - awful might happen 0  Total GAD 7 Score 5  Anxiety Difficulty Somewhat difficult    Outpatient Encounter Medications as of 01/23/2023  Medication Sig   albuterol (VENTOLIN HFA) 108 (90 Base) MCG/ACT inhaler    amphetamine-dextroamphetamine (ADDERALL) 10 MG tablet Take by mouth. Has not taken in 3 months  diazepam (VALIUM) 5 MG tablet Take 5 mg by mouth every 6 (six) hours as needed for anxiety.   EPINEPHrine 0.3 mg/0.3 mL IJ SOAJ injection SMARTSIG:1 Pre-Filled Pen Syringe IM Once   fexofenadine (ALLEGRA) 180 MG tablet Take 180 mg by mouth daily.   fluticasone (FLONASE) 50 MCG/ACT nasal spray Place 2 sprays into both nostrils daily.   Melatonin 10 MG CAPS Take by mouth at bedtime.   [DISCONTINUED] fexofenadine-pseudoephedrine (ALLEGRA-D 24) 180-240 MG 24 hr tablet Take 1 tablet by mouth daily.   [DISCONTINUED] hydrOXYzine (ATARAX) 10 MG tablet Take 10 mg by  mouth 3 (three) times daily.   [DISCONTINUED] levocetirizine (XYZAL) 5 MG tablet Take 5 mg by mouth daily as needed.   [DISCONTINUED] Vilazodone HCl (VIIBRYD) 10 MG TABS Take 10 mg by mouth daily.   [DISCONTINUED] budesonide (ENTOCORT EC) 3 MG 24 hr capsule Take 3 capsules (9 mg total) by mouth daily.   [DISCONTINUED] gabapentin (NEURONTIN) 300 MG capsule Take 1 capsule (300 mg total) by mouth 3 (three) times daily. Pt only taking 1 capsule a day   [DISCONTINUED] ibuprofen (ADVIL) 800 MG tablet TAKE 1 TABLET(800 MG) BY MOUTH EVERY 8 HOURS AS NEEDED FOR MODERATE PAIN   [DISCONTINUED] loratadine (CLARITIN) 10 MG tablet Take 10 mg by mouth daily.   [DISCONTINUED] metroNIDAZOLE (FLAGYL) 250 MG tablet Take 1 tablet (250 mg total) by mouth 3 (three) times daily. (Patient not taking: Reported on 10/29/2022)   [DISCONTINUED] traZODone (DESYREL) 50 MG tablet  (Patient not taking: Reported on 10/29/2022)   [DISCONTINUED] triamcinolone acetonide (KENALOG) 10 MG/ML injection 10 mg    No facility-administered encounter medications on file as of 01/23/2023.    Past Medical History:  Diagnosis Date   ADHD (attention deficit hyperactivity disorder)    Anxiety    Asthma    years ago   Seasonal allergies     Past Surgical History:  Procedure Laterality Date   ANKLE FUSION Left 12/07/2021   Procedure: LEFT LISFRANC FUSION;  Surgeon: Nadara Mustard, MD;  Location: [facility] OR;  Service: Orthopedics;  Laterality: Left;   COLONOSCOPY  09/20/2022   FOOT SURGERY Left    bone spurs    Family History  Problem Relation Age of Onset   Hypertension Mother    Hypothyroidism Mother    Diabetes Father    Stomach cancer Neg Hx    Colon cancer Neg Hx    Esophageal cancer Neg Hx     Social History   Socioeconomic History   Marital status: Married    Spouse name: Not on file   Number of children: 2   Years of education: Not on file   Highest education level: Not on file  Occupational History   Occupation:  accountant  Tobacco Use   Smoking status: Former    Types: Cigarettes    Quit date: 10/2016    Years since quitting: 6.2   Smokeless tobacco: Never  Vaping Use   Vaping Use: Never used  Substance and Sexual Activity   Alcohol use: Not Currently   Drug use: Never   Sexual activity: Yes    Comment: husband has vasectemy  Other Topics Concern   Not on file  Social History Narrative   Not on file   Social Determinants of Health   Financial Resource Strain: Low Risk  (01/23/2023)   Overall Financial Resource Strain (CARDIA)    Difficulty of Paying Living Expenses: Not hard at all  Food Insecurity: No Food Insecurity (01/23/2023)  Hunger Vital Sign    Worried About Running Out of Food in the Last Year: Never true    Ran Out of Food in the Last Year: Never true  Transportation Needs: No Transportation Needs (01/23/2023)   PRAPARE - Administrator, Civil Service (Medical): No    Lack of Transportation (Non-Medical): No  Physical Activity: Inactive (01/23/2023)   Exercise Vital Sign    Days of Exercise per Week: 0 days    Minutes of Exercise per Session: 0 min  Stress: Stress Concern Present (01/23/2023)   [employer] of Occupational Health - Occupational Stress Questionnaire    Feeling of Stress : Rather much  Social Connections: Moderately Isolated (01/23/2023)   Social Connection and Isolation Panel [NHANES]    Frequency of Communication with Friends and Family: More than three times a week    Frequency of Social Gatherings with Friends and Family: More than three times a week    Attends Religious Services: Never    Database administrator or Organizations: No    Attends Banker Meetings: Never    Marital Status: Married  Catering manager Violence: Not At Risk (01/23/2023)   Humiliation, Afraid, Rape, and Kick questionnaire    Fear of Current or Ex-Partner: No    Emotionally Abused: No    Physically Abused: No    Sexually Abused: No    Review of  Systems  Constitutional:  Negative for chills, diaphoresis, fever and weight loss.  Respiratory:  Negative for cough, shortness of breath and wheezing.   Cardiovascular:  Negative for chest pain, palpitations, orthopnea and leg swelling.  Neurological: Negative.   Endo/Heme/Allergies:  Positive for environmental allergies.  Psychiatric/Behavioral: Negative.         Objective    BP 131/81   Pulse 98   Temp 98 F (36.7 C) (Oral)   Ht 5\' 10"  (1.778 m)   Wt 185 lb (83.9 kg)   SpO2 98%   BMI 26.54 kg/m   Physical Exam Vitals and nursing note reviewed.  Constitutional:      General: She is awake. She is not in acute distress.    Appearance: She is well-developed and well-groomed. She is not ill-appearing or toxic-appearing.  HENT:     Head: Normocephalic.     Right Ear: Hearing, tympanic membrane, ear canal and external ear normal.     Left Ear: Hearing, tympanic membrane, ear canal and external ear normal.     Nose: Nose normal. No rhinorrhea.     Right Sinus: No maxillary sinus tenderness or frontal sinus tenderness.     Left Sinus: No maxillary sinus tenderness or frontal sinus tenderness.     Mouth/Throat:     Mouth: Mucous membranes are moist.     Pharynx: No pharyngeal swelling, oropharyngeal exudate or posterior oropharyngeal erythema.  Eyes:     General: Lids are normal.        Right eye: No discharge.        Left eye: No discharge.     Conjunctiva/sclera: Conjunctivae normal.     Pupils: Pupils are equal, round, and reactive to light.  Neck:     Thyroid: No thyromegaly.     Vascular: No carotid bruit.  Cardiovascular:     Rate and Rhythm: Normal rate and regular rhythm.     Heart sounds: Normal heart sounds. No murmur heard.    No gallop.  Pulmonary:     Effort: Pulmonary effort is normal. No accessory muscle  usage or respiratory distress.     Breath sounds: Normal breath sounds.  Abdominal:     General: Bowel sounds are normal. There is no distension.      Palpations: Abdomen is soft.     Tenderness: There is no abdominal tenderness.  Musculoskeletal:     Cervical back: Normal range of motion and neck supple.     Right lower leg: No edema.     Left lower leg: No edema.  Lymphadenopathy:     Cervical: No cervical adenopathy.  Skin:    General: Skin is warm and dry.  Neurological:     Mental Status: She is alert and oriented to person, place, and time.     Cranial Nerves: Cranial nerves 2-12 are intact.     Coordination: Coordination is intact.     Gait: Gait is intact.     Deep Tendon Reflexes: Reflexes are normal and symmetric.     Reflex Scores:      Brachioradialis reflexes are 2+ on the right side and 2+ on the left side.      Patellar reflexes are 2+ on the right side and 2+ on the left side. Psychiatric:        Attention and Perception: Attention normal.        Mood and Affect: Mood normal.        Speech: Speech normal.        Behavior: Behavior normal. Behavior is cooperative.        Thought Content: Thought content normal.     Last CBC Lab Results  Component Value Date   WBC 7.8 06/14/2022   HGB 13.5 06/14/2022   HCT 39.9 06/14/2022   MCV 79.2 (L) 06/14/2022   MCH 26.8 06/14/2022   RDW 11.9 06/14/2022   PLT 285 06/14/2022   Last metabolic panel Lab Results  Component Value Date   GLUCOSE 94 06/14/2022   NA 137 06/14/2022   K 2.6 (LL) 06/14/2022   CL 103 06/14/2022   CO2 27 06/14/2022   BUN 11 06/14/2022   CREATININE 0.69 06/14/2022   GFRNONAA >60 06/14/2022   CALCIUM 9.1 06/14/2022   PROT 8.0 06/14/2022   ALBUMIN 4.0 06/14/2022   BILITOT 0.6 06/14/2022   ALKPHOS 70 06/14/2022   AST 15 06/14/2022   ALT 14 06/14/2022   ANIONGAP 7 06/14/2022      Assessment & Plan:   Problem List Items Addressed This Visit       Digestive   Microscopic colitis    Acute and improved at this time.  Will return to GI as needed.        Other   ADD (attention deficit disorder) - Primary    Chronic, ongoing.  She  has been followed by Dr. Evelene Croon in [place] for 5 years, recommend she maintain this relationship and visits with them.  Is currently on Adderall and Valium, with increased use Valium recently due to work stressors, would be beneficial to maintain psychiatry collaboration due to higher risk medication regimen.      Relevant Orders   CBC with Differential/Platelet   TSH   Elevated low density lipoprotein (LDL) cholesterol level    Noted on past labs, is fasting this morning.  Check labs and initiate medication in future if needed.      Relevant Orders   Comprehensive metabolic panel   Lipid Panel w/o Chol/HDL Ratio   Environmental allergies    Chronic, ongoing.  Followed by allergist and obtaining shots.  Continue  current medication regimen and collaboration.      Situational anxiety    Chronic, ongoing due to work stressors.  Followed by Dr. Evelene Croon in [place], has been seeing provider for 5 years.  Is currently on Adderall and Valium, with increased use Valium recently due to work stressors, would be beneficial to maintain psychiatry collaboration due to higher risk medication regimen.  Discussed with her and recommend she continue collaboration with psychiatry and obtain refills from them.      Other Visit Diagnoses     Vitamin D deficiency       History of low levels reported.  Check today and start supplement as needed.   Relevant Orders   VITAMIN D 25 Hydroxy (Vit-D Deficiency, Fractures)   Encounter for screening mammogram for malignant neoplasm of breast       Mammogram ordered today and recommend she call and schedule.   Relevant Orders   MM 3D SCREENING MAMMOGRAM BILATERAL BREAST   Encounter for screening for HIV       HIV screen on labs today per guidelines for one time screening, discussed with patient.   Relevant Orders   HIV Antibody (routine testing w rflx)   Need for hepatitis C screening test       Hep C screen on labs today per guidelines for one time screening,  discussed with patient.   Relevant Orders   Hepatitis C antibody   Encounter to establish care       Establishing care today, introduced to clinic setting and provider.       Return in about 6 months (around 07/25/2023) for MOOD.   Marjie Skiff, NP

## 2023-01-23 NOTE — Assessment & Plan Note (Signed)
Chronic, ongoing.  She has been followed by Dr. Evelene Croon in [place] for 5 years, recommend she maintain this relationship and visits with them.  Is currently on Adderall and Valium, with increased use Valium recently due to work stressors, would be beneficial to maintain psychiatry collaboration due to higher risk medication regimen.

## 2023-01-23 NOTE — Assessment & Plan Note (Signed)
Noted on past labs, is fasting this morning.  Check labs and initiate medication in future if needed.

## 2023-01-23 NOTE — Assessment & Plan Note (Signed)
Chronic, ongoing due to work stressors.  Followed by Dr. Evelene Croon in [place], has been seeing provider for 5 years.  Is currently on Adderall and Valium, with increased use Valium recently due to work stressors, would be beneficial to maintain psychiatry collaboration due to higher risk medication regimen.  Discussed with her and recommend she continue collaboration with psychiatry and obtain refills from them.

## 2023-01-23 NOTE — Assessment & Plan Note (Signed)
Acute and improved at this time.  Will return to GI as needed.

## 2023-01-23 NOTE — Assessment & Plan Note (Signed)
Chronic, ongoing.  Followed by allergist and obtaining shots.  Continue current medication regimen and collaboration.

## 2023-01-24 LAB — COMPREHENSIVE METABOLIC PANEL
ALT: 10 IU/L (ref 0–32)
Albumin/Globulin Ratio: 1.5 (ref 1.2–2.2)
CO2: 23 mmol/L (ref 20–29)
Creatinine, Ser: 0.76 mg/dL (ref 0.57–1.00)
Sodium: 139 mmol/L (ref 134–144)
Total Protein: 7 g/dL (ref 6.0–8.5)
eGFR: 95 mL/min/{1.73_m2} (ref >59)

## 2023-01-24 LAB — HIV ANTIBODY (ROUTINE TESTING W REFLEX): HIV Screen 4th Generation wRfx: NONREACTIVE

## 2023-01-24 LAB — CBC WITH DIFFERENTIAL/PLATELET
EOS (ABSOLUTE): 0.2 10*3/uL (ref 0.0–0.4)
Eos: 3 %
Hemoglobin: 13.8 g/dL (ref 11.1–15.9)
Neutrophils Absolute: 4.4 10*3/uL (ref 1.4–7.0)
RBC: 5.1 x10E6/uL (ref 3.77–5.28)
WBC: 6.1 10*3/uL (ref 3.4–10.8)

## 2023-01-24 LAB — HEPATITIS C ANTIBODY

## 2023-01-24 LAB — LIPID PANEL W/O CHOL/HDL RATIO: Triglycerides: 77 mg/dL (ref 0–149)

## 2023-01-24 NOTE — Progress Notes (Signed)
Contacted via MyChart    Good morning Brittney Alvarez, your labs have returned and overall these are stable with exception of LDL (bad cholesterol) and Vitamin D.  Your HIV and Hep C labs are still pending and if abnormal I will let you know.  For Vitamin D I recommend starting Vitamin D3 2000 units daily for overall bone health, you can get this over the counter.  Your LDL is above normal. The LDL is the bad cholesterol. Over time and in combination with inflammation and other factors, this contributes to plaque which in turn may lead to stroke and/or heart attack down the road. Sometimes high LDL is primarily genetic, and people might be eating all the right foods but still have high numbers. Other times, there is room for improvement in one's diet and eating healthier can bring this number down and potentially reduce one's risk of heart attack and/or stroke.   To reduce your LDL, Remember - more fruits and vegetables, more fish, and limit red meat and dairy products. More soy, nuts, beans, barley, lentils, oats and plant sterol ester enriched margarine instead of butter. I also encourage eliminating sugar and processed food. Remember, shop on the outside of the grocery store and visit your [employer]. If you would like to talk with me about dietary changes plus or minus medications for your cholesterol, please let me know. We should recheck your cholesterol in 12 months.  Any questions? Keep being amazing!!  Thank you for allowing me to participate in your care.  I appreciate you. Kindest regards, Jasaiah Karwowski

## 2023-01-28 ENCOUNTER — Ambulatory Visit: Payer: BC Managed Care – PPO | Admitting: Internal Medicine

## 2023-01-28 LAB — CBC WITH DIFFERENTIAL/PLATELET
Basophils Absolute: 0 10*3/uL (ref 0.0–0.2)
Basos: 1 %
Hematocrit: 44 % (ref 34.0–46.6)
Immature Grans (Abs): 0 10*3/uL (ref 0.0–0.1)
Immature Granulocytes: 0 %
Lymphocytes Absolute: 1.1 10*3/uL (ref 0.7–3.1)
Lymphs: 18 %
MCH: 27.1 pg (ref 26.6–33.0)
MCHC: 31.4 g/dL — ABNORMAL LOW (ref 31.5–35.7)
MCV: 86 fL (ref 79–97)
Monocytes Absolute: 0.4 10*3/uL (ref 0.1–0.9)
Monocytes: 7 %
Neutrophils: 71 %
Platelets: 252 10*3/uL (ref 150–450)
RDW: 12.9 % (ref 11.7–15.4)

## 2023-01-28 LAB — COMPREHENSIVE METABOLIC PANEL
AST: 12 IU/L (ref 0–40)
Albumin: 4.2 g/dL (ref 3.9–4.9)
Alkaline Phosphatase: 109 IU/L (ref 44–121)
BUN/Creatinine Ratio: 14 (ref 9–23)
BUN: 11 mg/dL (ref 6–24)
Bilirubin Total: 0.3 mg/dL (ref 0.0–1.2)
Calcium: 9.2 mg/dL (ref 8.7–10.2)
Chloride: 103 mmol/L (ref 96–106)
Globulin, Total: 2.8 g/dL (ref 1.5–4.5)
Glucose: 75 mg/dL (ref 70–99)
Potassium: 4.2 mmol/L (ref 3.5–5.2)

## 2023-01-28 LAB — LIPID PANEL W/O CHOL/HDL RATIO
Cholesterol, Total: 192 mg/dL (ref 100–199)
HDL: 58 mg/dL (ref >39)
LDL Chol Calc (NIH): 120 mg/dL — ABNORMAL HIGH (ref 0–99)
VLDL Cholesterol Cal: 14 mg/dL (ref 5–40)

## 2023-01-28 LAB — VITAMIN D 25 HYDROXY (VIT D DEFICIENCY, FRACTURES): Vit D, 25-Hydroxy: 28.8 ng/mL — ABNORMAL LOW (ref 30.0–100.0)

## 2023-01-28 LAB — TSH: TSH: 0.963 u[IU]/mL (ref 0.450–4.500)

## 2023-01-29 DIAGNOSIS — J301 Allergic rhinitis due to pollen: Secondary | ICD-10-CM | POA: Diagnosis not present

## 2023-01-30 ENCOUNTER — Encounter: Payer: Self-pay | Admitting: Nurse Practitioner

## 2023-01-30 DIAGNOSIS — J301 Allergic rhinitis due to pollen: Secondary | ICD-10-CM | POA: Diagnosis not present

## 2023-01-31 ENCOUNTER — Telehealth (INDEPENDENT_AMBULATORY_CARE_PROVIDER_SITE_OTHER): Payer: BC Managed Care – PPO | Admitting: Family Medicine

## 2023-01-31 DIAGNOSIS — J0101 Acute recurrent maxillary sinusitis: Secondary | ICD-10-CM

## 2023-01-31 MED ORDER — AMOXICILLIN 500 MG PO CAPS
1000.0000 mg | ORAL_CAPSULE | Freq: Three times a day (TID) | ORAL | 0 refills | Status: AC
Start: 2023-01-31 — End: 2023-02-10

## 2023-01-31 NOTE — Telephone Encounter (Signed)
Called and scheduled appointment this morning with Rashelle at 10:00 am.

## 2023-01-31 NOTE — Progress Notes (Cosign Needed Addendum)
"This visit was completed via video. All issues as above were discussed and addressed but no physical exam was performed. If it was felt that the patient should be evaluated in the office, they were directed there. The patient verbally consented to this visit, this visit was done through video contact only.","This visit was completed via video visit through MyChart. All issues as above were discussed and addressed. Physical exam was done as above through visual confirmation on video through MyChart. If it was felt that the patient should be evaluated in the office, they were directed there. The patient verbally consented to this visit."} Location of the patient: work Location of the provider: work Those involved with this call:  Provider:  Prescott Gum, FNP CMA: Wilhemena Durie, CMA Front Desk/Registration: Kandice Hams  Time spent on call:  10 minutes with patient face to face via video conference. More than 50% of this time was spent in counseling and coordination of care. 10 minutes total spent in review of patient's record and preparation of their chart.   Subjective:    Patient ID: Brittney Alvarez, female    DOB: 10-13-1970, 52 y.o.   MRN: [id number]  HPI: Brittney Alvarez is a 52 y.o. female  No chief complaint on file.  UPPER RESPIRATORY TRACT INFECTION She is taking allergy shots x2 week at [facility]. She had a crown replaced on tooth 01/22/23 , went back to the dentist for concerns of infection due to "rotten egg" smell, dentist did not see signs of infection. Patient complains she continues to smell the "rotten egg" / "sour" smell.  She complains of drainage down her throat when lying flat at night and constant drainage throughout the day that has lasted for over a month. She was told her allergy shots could be contributing to this.  Duration: over a mouth Worst symptom: Sinus pressure and drainage in throat Fever: no Cough: yes Shortness of breath: yes using inhaler daily, which  helps.  Wheezing: no Chest pain: no Chest tightness: no Chest congestion: yes Nasal congestion: yes Runny nose: yes Post nasal drip: yes Sneezing: yes Sore throat: no Swollen glands: no Sinus pressure: yes Headache: yes Face pain: no Toothache: no Ear pain: no Ear pressure: no  Eyes red/itching:no Eye drainage/crusting: no  Vomiting: no Rash: no Fatigue: yes Sick contacts: no Strep contacts: no  Context: Stable Recurrent sinusitis: yes yearly Relief with OTC cold/cough medications: no  Treatments attempted: Allegra, which helped  Relevant past medical, surgical, family and social history reviewed and updated as indicated. Interim medical history since our last visit reviewed. Allergies and medications reviewed and updated.  Review of Systems  Constitutional:  Positive for fatigue. Negative for fever.  HENT:  Positive for congestion, postnasal drip, rhinorrhea, sinus pressure and sneezing. Negative for ear discharge, ear pain, facial swelling, sinus pain and sore throat.   Eyes:  Negative for discharge, redness and itching.  Respiratory:  Positive for cough and shortness of breath. Negative for apnea, chest tightness and wheezing.   Cardiovascular:  Negative for chest pain, palpitations and leg swelling.  Gastrointestinal:  Negative for nausea and vomiting.  Allergic/Immunologic: Positive for environmental allergies and food allergies.  Neurological:  Positive for headaches.   Per HPI unless specifically indicated above    Objective:    There were no vitals taken for this visit.  Wt Readings from Last 3 Encounters:  01/23/23 185 lb (83.9 kg)  10/29/22 182 lb (82.6 kg)  09/20/22 196 lb (88.9 kg)  Physical Exam HENT:     Nose: Congestion and rhinorrhea present.  Pulmonary:     Effort: Pulmonary effort is normal.  Neurological:     Mental Status: She is alert.    Results for orders placed or performed in visit on 01/23/23  CBC with Differential/Platelet   Result Value Ref Range   WBC 6.1 3.4 - 10.8 x10E3/uL   RBC 5.10 3.77 - 5.28 x10E6/uL   Hemoglobin 13.8 11.1 - 15.9 g/dL   Hematocrit 16.1 09.6 - 46.6 %   MCV 86 79 - 97 fL   MCH 27.1 26.6 - 33.0 pg   MCHC 31.4 (L) 31.5 - 35.7 g/dL   RDW 04.5 40.9 - 81.1 %   Platelets 252 150 - 450 x10E3/uL   Neutrophils 71 Not Estab. %   Lymphs 18 Not Estab. %   Monocytes 7 Not Estab. %   Eos 3 Not Estab. %   Basos 1 Not Estab. %   Neutrophils Absolute 4.4 1.4 - 7.0 x10E3/uL   Lymphocytes Absolute 1.1 0.7 - 3.1 x10E3/uL   Monocytes Absolute 0.4 0.1 - 0.9 x10E3/uL   EOS (ABSOLUTE) 0.2 0.0 - 0.4 x10E3/uL   Basophils Absolute 0.0 0.0 - 0.2 x10E3/uL   Immature Granulocytes 0 Not Estab. %   Immature Grans (Abs) 0.0 0.0 - 0.1 x10E3/uL  Comprehensive metabolic panel  Result Value Ref Range   Glucose 75 70 - 99 mg/dL   BUN 11 6 - 24 mg/dL   Creatinine, Ser 9.14 0.57 - 1.00 mg/dL   eGFR 95 >78 GN/FAO/1.30   BUN/Creatinine Ratio 14 9 - 23   Sodium 139 134 - 144 mmol/L   Potassium 4.2 3.5 - 5.2 mmol/L   Chloride 103 96 - 106 mmol/L   CO2 23 20 - 29 mmol/L   Calcium 9.2 8.7 - 10.2 mg/dL   Total Protein 7.0 6.0 - 8.5 g/dL   Albumin 4.2 3.9 - 4.9 g/dL   Globulin, Total 2.8 1.5 - 4.5 g/dL   Albumin/Globulin Ratio 1.5 1.2 - 2.2   Bilirubin Total 0.3 0.0 - 1.2 mg/dL   Alkaline Phosphatase 109 44 - 121 IU/L   AST 12 0 - 40 IU/L   ALT 10 0 - 32 IU/L  Lipid Panel w/o Chol/HDL Ratio  Result Value Ref Range   Cholesterol, Total 192 100 - 199 mg/dL   Triglycerides 77 0 - 149 mg/dL   HDL 58 >86 mg/dL   VLDL Cholesterol Cal 14 5 - 40 mg/dL   LDL Chol Calc (NIH) 578 (H) 0 - 99 mg/dL  TSH  Result Value Ref Range   TSH 0.963 0.450 - 4.500 uIU/mL  Hepatitis C antibody  Result Value Ref Range   Hep C Virus Ab Non Reactive Non Reactive  HIV Antibody (routine testing w rflx)  Result Value Ref Range   HIV Screen 4th Generation wRfx Non Reactive Non Reactive  VITAMIN D 25 Hydroxy (Vit-D Deficiency,  Fractures)  Result Value Ref Range   Vit D, 25-Hydroxy 28.8 (L) 30.0 - 100.0 ng/mL      Assessment & Plan:   Problem List Items Addressed This Visit   None Visit Diagnoses     Acute recurrent maxillary sinusitis    -  Primary   Acute, ongoing. Amoxicillin 1g q8 hours for 10 days, take with food if GI upset occurs.F/u if symptoms do not improve, will consider steroid taper vs injection.   Relevant Medications   amoxicillin (AMOXIL) 500 MG capsule  Follow up plan: No follow-ups on file.

## 2023-02-03 DIAGNOSIS — J301 Allergic rhinitis due to pollen: Secondary | ICD-10-CM | POA: Diagnosis not present

## 2023-02-10 DIAGNOSIS — J301 Allergic rhinitis due to pollen: Secondary | ICD-10-CM | POA: Diagnosis not present

## 2023-02-12 ENCOUNTER — Encounter: Payer: Self-pay | Admitting: Nurse Practitioner

## 2023-02-13 DIAGNOSIS — J301 Allergic rhinitis due to pollen: Secondary | ICD-10-CM | POA: Diagnosis not present

## 2023-02-21 ENCOUNTER — Ambulatory Visit (INDEPENDENT_AMBULATORY_CARE_PROVIDER_SITE_OTHER): Payer: BC Managed Care – PPO

## 2023-02-21 DIAGNOSIS — Z23 Encounter for immunization: Secondary | ICD-10-CM | POA: Diagnosis not present

## 2023-02-21 NOTE — Progress Notes (Signed)
Patient presents today for Shingrix and Td vaccination, patient received in Shingrix right deltoid and Td left deltoid, patient tolerated well. Patient was also informed to return to office for 2nd Shingrix vaccination in 2 to 6 months. Patient verbalized understanding

## 2023-02-24 DIAGNOSIS — J301 Allergic rhinitis due to pollen: Secondary | ICD-10-CM | POA: Diagnosis not present

## 2023-02-27 DIAGNOSIS — J301 Allergic rhinitis due to pollen: Secondary | ICD-10-CM | POA: Diagnosis not present

## 2023-03-06 DIAGNOSIS — J301 Allergic rhinitis due to pollen: Secondary | ICD-10-CM | POA: Diagnosis not present

## 2023-03-13 DIAGNOSIS — J301 Allergic rhinitis due to pollen: Secondary | ICD-10-CM | POA: Diagnosis not present

## 2023-03-17 DIAGNOSIS — J301 Allergic rhinitis due to pollen: Secondary | ICD-10-CM | POA: Diagnosis not present

## 2023-03-19 DIAGNOSIS — J301 Allergic rhinitis due to pollen: Secondary | ICD-10-CM | POA: Diagnosis not present

## 2023-04-03 DIAGNOSIS — J301 Allergic rhinitis due to pollen: Secondary | ICD-10-CM | POA: Diagnosis not present

## 2023-04-10 DIAGNOSIS — J301 Allergic rhinitis due to pollen: Secondary | ICD-10-CM | POA: Diagnosis not present

## 2023-04-17 DIAGNOSIS — J301 Allergic rhinitis due to pollen: Secondary | ICD-10-CM | POA: Diagnosis not present

## 2023-04-24 DIAGNOSIS — J301 Allergic rhinitis due to pollen: Secondary | ICD-10-CM | POA: Diagnosis not present

## 2023-05-01 DIAGNOSIS — J301 Allergic rhinitis due to pollen: Secondary | ICD-10-CM | POA: Diagnosis not present

## 2023-05-05 DIAGNOSIS — J301 Allergic rhinitis due to pollen: Secondary | ICD-10-CM | POA: Diagnosis not present

## 2023-05-08 DIAGNOSIS — J301 Allergic rhinitis due to pollen: Secondary | ICD-10-CM | POA: Diagnosis not present

## 2023-05-12 DIAGNOSIS — J069 Acute upper respiratory infection, unspecified: Secondary | ICD-10-CM | POA: Diagnosis not present

## 2023-05-12 DIAGNOSIS — J302 Other seasonal allergic rhinitis: Secondary | ICD-10-CM | POA: Diagnosis not present

## 2023-05-22 DIAGNOSIS — J301 Allergic rhinitis due to pollen: Secondary | ICD-10-CM | POA: Diagnosis not present

## 2023-05-29 DIAGNOSIS — J301 Allergic rhinitis due to pollen: Secondary | ICD-10-CM | POA: Diagnosis not present

## 2023-06-04 DIAGNOSIS — J301 Allergic rhinitis due to pollen: Secondary | ICD-10-CM | POA: Diagnosis not present

## 2023-06-05 DIAGNOSIS — J301 Allergic rhinitis due to pollen: Secondary | ICD-10-CM | POA: Diagnosis not present

## 2023-06-12 DIAGNOSIS — J301 Allergic rhinitis due to pollen: Secondary | ICD-10-CM | POA: Diagnosis not present

## 2023-06-23 DIAGNOSIS — J301 Allergic rhinitis due to pollen: Secondary | ICD-10-CM | POA: Diagnosis not present

## 2023-06-30 DIAGNOSIS — J301 Allergic rhinitis due to pollen: Secondary | ICD-10-CM | POA: Diagnosis not present

## 2023-07-07 DIAGNOSIS — J301 Allergic rhinitis due to pollen: Secondary | ICD-10-CM | POA: Diagnosis not present

## 2023-07-14 DIAGNOSIS — J301 Allergic rhinitis due to pollen: Secondary | ICD-10-CM | POA: Diagnosis not present

## 2023-07-19 NOTE — Patient Instructions (Addendum)
Magnesium 400 MG at night  Managing Anxiety, Adult After being diagnosed with anxiety, you may be relieved to know why you have felt or behaved a certain way. You may also feel overwhelmed about the treatment ahead and what it will mean for your life. With care and support, you can manage your anxiety. How to manage lifestyle changes Understanding the difference between stress and anxiety Although stress can play a role in anxiety, it is not the same as anxiety. Stress is your body's reaction to life changes and events, both good and bad. Stress is often caused by something external, such as a deadline, test, or competition. It normally goes away after the event has ended and will last just a few hours. But, stress can be ongoing and can lead to more than just stress. Anxiety is caused by something internal, such as imagining a terrible outcome or worrying that something will go wrong that will greatly upset you. Anxiety often does not go away even after the event is over, and it can become a long-term (chronic) worry. Lowering stress and anxiety Talk with your health care provider or a counselor to learn more about lowering anxiety and stress. They may suggest tension-reduction techniques, such as: Music. Spend time creating or listening to music that you enjoy and that inspires you. Mindfulness-based meditation. Practice being aware of your normal breaths while not trying to control your breathing. It can be done while sitting or walking. Centering prayer. Focus on a word, phrase, or sacred image that means something to you and brings you peace. Deep breathing. Expand your stomach and inhale slowly through your nose. Hold your breath for 3-5 seconds. Then breathe out slowly, letting your stomach muscles relax. Self-talk. Learn to notice and spot thought patterns that lead to anxiety reactions. Change those patterns to thoughts that feel peaceful. Muscle relaxation. Take time to tense muscles and  then relax them. Choose a tension-reduction technique that fits your lifestyle and personality. These techniques take time and practice. Set aside 5-15 minutes a day to do them. Specialized therapists can offer counseling and training in these techniques. The training to help with anxiety may be covered by some insurance plans. Other things you can do to manage stress and anxiety include: Keeping a stress diary. This can help you learn what triggers your reaction and then learn ways to manage your response. Thinking about how you react to certain situations. You may not be able to control everything, but you can control your response. Making time for activities that help you relax and not feeling guilty about spending your time in this way. Doing visual imagery. This involves imagining or creating mental pictures to help you relax. Practicing yoga. Through yoga poses, you can lower tension and relax.  Medicines Medicines for anxiety include: Antidepressant medicines. These are usually prescribed for long-term daily control. Anti-anxiety medicines. These may be added in severe cases, especially when panic attacks occur. When used together, medicines, psychotherapy, and tension-reduction techniques may be the most effective treatment. Relationships Relationships can play a big part in helping you recover. Spend more time connecting with trusted friends and family members. Think about going to couples counseling if you have a partner, taking family education classes, or going to family therapy. Therapy can help you and others better understand your anxiety. How to recognize changes in your anxiety Everyone responds differently to treatment for anxiety. Recovery from anxiety happens when symptoms lessen and stop interfering with your daily life at home or  work. This may mean that you will start to: Have better concentration and focus. Worry will interfere less in your daily thinking. Sleep better. Be  less irritable. Have more energy. Have improved memory. Try to recognize when your condition is getting worse. Contact your provider if your symptoms interfere with home or work and you feel like your condition is not improving. Follow these instructions at home: Activity Exercise. Adults should: Exercise for at least 150 minutes each week. The exercise should increase your heart rate and make you sweat (moderate-intensity exercise). Do strengthening exercises at least twice a week. Get the right amount and quality of sleep. Most adults need 7-9 hours of sleep each night. Lifestyle  Eat a healthy diet that includes plenty of vegetables, fruits, whole grains, low-fat dairy products, and lean protein. Do not eat a lot of foods that are high in fats, added sugars, or salt (sodium). Make choices that simplify your life. Do not use any products that contain nicotine or tobacco. These products include cigarettes, chewing tobacco, and vaping devices, such as e-cigarettes. If you need help quitting, ask your provider. Avoid caffeine, alcohol, and certain over-the-counter cold medicines. These may make you feel worse. Ask your pharmacist which medicines to avoid. General instructions Take over-the-counter and prescription medicines only as told by your provider. Keep all follow-up visits. This is to make sure you are managing your anxiety well or if you need more support. Where to find support You can get help and support from: Self-help groups. Online and [employer]. A trusted spiritual leader. Couples counseling. Family education classes. Family therapy. Where to find more information You may find that joining a support group helps you deal with your anxiety. The following sources can help you find counselors or support groups near you: Mental Health America: mentalhealthamerica.net Anxiety and Depression Association of [place] (ADAA): adaa.org The [employer] on Mental  Illness (NAMI): nami.org Contact a health care provider if: You have a hard time staying focused or finishing tasks. You spend many hours a day feeling worried about everyday life. You are very tired because you cannot stop worrying. You start to have headaches or often feel tense. You have chronic nausea or diarrhea. Get help right away if: Your heart feels like it is racing. You have shortness of breath. You have thoughts of hurting yourself or others. Get help right away if you feel like you may hurt yourself or others, or have thoughts about taking your own life. Go to your nearest emergency room or: Call 911. Call the National Suicide Prevention Lifeline at 716-712-1469 or 988. This is open 24 hours a day. Text the Crisis Text Line at 606-627-2403. This information is not intended to replace advice given to you by your health care provider. Make sure you discuss any questions you have with your health care provider. Document Revised: 05/14/2022 Document Reviewed: 11/26/2020 Elsevier Patient Education  2024 [employer].

## 2023-07-21 DIAGNOSIS — J301 Allergic rhinitis due to pollen: Secondary | ICD-10-CM | POA: Diagnosis not present

## 2023-07-25 ENCOUNTER — Encounter: Payer: Self-pay | Admitting: Nurse Practitioner

## 2023-07-25 ENCOUNTER — Ambulatory Visit (INDEPENDENT_AMBULATORY_CARE_PROVIDER_SITE_OTHER): Payer: BC Managed Care – PPO | Admitting: Nurse Practitioner

## 2023-07-25 VITALS — BP 117/79 | HR 93 | Temp 97.7°F | Ht 70.0 in | Wt 186.2 lb

## 2023-07-25 DIAGNOSIS — R309 Painful micturition, unspecified: Secondary | ICD-10-CM | POA: Diagnosis not present

## 2023-07-25 DIAGNOSIS — F418 Other specified anxiety disorders: Secondary | ICD-10-CM

## 2023-07-25 DIAGNOSIS — F902 Attention-deficit hyperactivity disorder, combined type: Secondary | ICD-10-CM | POA: Diagnosis not present

## 2023-07-25 DIAGNOSIS — Z23 Encounter for immunization: Secondary | ICD-10-CM

## 2023-07-25 DIAGNOSIS — R8281 Pyuria: Secondary | ICD-10-CM

## 2023-07-25 DIAGNOSIS — N760 Acute vaginitis: Secondary | ICD-10-CM

## 2023-07-25 DIAGNOSIS — B9689 Other specified bacterial agents as the cause of diseases classified elsewhere: Secondary | ICD-10-CM

## 2023-07-25 LAB — URINALYSIS, ROUTINE W REFLEX MICROSCOPIC
Bilirubin, UA: NEGATIVE
Glucose, UA: NEGATIVE
Ketones, UA: NEGATIVE
Nitrite, UA: NEGATIVE
Specific Gravity, UA: >1.03 — ABNORMAL HIGH (ref 1.005–1.030)
Urobilinogen, Ur: 0.2 mg/dL (ref 0.2–1.0)
pH, UA: 6 (ref 5.0–7.5)

## 2023-07-25 LAB — MICROSCOPIC EXAMINATION

## 2023-07-25 LAB — WET PREP FOR TRICH, YEAST, CLUE
Clue Cell Exam: POSITIVE — AB
Trichomonas Exam: NEGATIVE
Yeast Exam: NEGATIVE

## 2023-07-25 MED ORDER — FLUCONAZOLE 150 MG PO TABS: 150.0000 mg | ORAL_TABLET | Freq: Every day | ORAL | 0 refills | Status: DC

## 2023-07-25 MED ORDER — METRONIDAZOLE 500 MG PO TABS: 500.0000 mg | ORAL_TABLET | Freq: Two times a day (BID) | ORAL | 0 refills | Status: AC

## 2023-07-25 MED ORDER — AMOXICILLIN-POT CLAVULANATE 875-125 MG PO TABS: 1.0000 | ORAL_TABLET | Freq: Two times a day (BID) | ORAL | 0 refills | Status: AC

## 2023-07-25 NOTE — Assessment & Plan Note (Signed)
Acute for one week.  UA has some positives, will start Augmentin and send for culture.  Increase water intake at home and take Azo as needed.  Adjust regimen as needed based on culture.

## 2023-07-25 NOTE — Assessment & Plan Note (Signed)
Chronic, ongoing.  She has been followed by Dr. Evelene Croon in [place] for 5 years, recommend she maintain this relationship and visits with them.  Is currently on Valium as needed.  Would be beneficial to maintain psychiatry collaboration due to higher risk medication regimen.

## 2023-07-25 NOTE — Progress Notes (Signed)
BP 117/79   Pulse 93   Temp 97.7 F (36.5 C) (Oral)   Ht 5\' 10"  (1.778 m)   Wt 186 lb 3.2 oz (84.5 kg)   SpO2 97%   BMI 26.72 kg/m    Subjective:    Patient ID: Brittney Alvarez, female    DOB: [birth year], 53 y.o.   MRN: [id number]  HPI: Brittney Alvarez is a 52 y.o. female  Chief Complaint  Patient presents with   Anxiety   Depression   Urinary Tract Infection    Patient states she has been having pain with urination for the last week    ANXIETY & ADHD Diagnosed with ADHD about 5 years ago. Follows with Dr. Evelene Croon in [place] for this, psychiatry.  Has been with her for 5 years.  They discussed Vyvanse, but this was too costly. Was  taking Adderall and this caused irritability.  Visits with Dr. Evelene Croon every 6 months. Uses Valium for anxiety as needed, ordered by psychiatry -- one bottle may last 6 months.  Works for [employer] in [place], recently changed management with more stressors with this.  In past she took Lexapro for mood, but this reduced sexual drive. Duration:stable Anxious mood: yes sometimes Excessive worrying: occasional Irritability: no  Sweating: no Nausea: no Palpitations:no Hyperventilation: no Panic attacks: no Agoraphobia: no  Obscessions/compulsions: no Depressed mood: no    08-09-23    8:44 AM 01/23/2023    8:20 AM  Depression screen PHQ 2/9  Decreased Interest 0 1  Down, Depressed, Hopeless 1 1  PHQ - 2 Score 1 2  Altered sleeping 3 1  Tired, decreased energy 1 1  Change in appetite 1 1  Feeling bad or failure about yourself  0 0  Trouble concentrating 2 1  Moving slowly or fidgety/restless 1 0  Suicidal thoughts 0 0  PHQ-9 Score 9 6  Difficult doing work/chores Not difficult at all Somewhat difficult  Anhedonia: no Weight changes: no Insomnia: yes hard to stay asleep  Hypersomnia: no Fatigue/loss of energy: no Feelings of worthlessness: no Feelings of guilt: no Impaired concentration/indecisiveness: yes Suicidal ideations: no  Crying  spells: no Recent Stressors/Life Changes: no   Relationship problems: no   Family stress: no     Financial stress: no    Job stress: no    Recent death/loss: no     2023/08/09    8:44 AM 01/23/2023    8:20 AM  GAD 7 : Generalized Anxiety Score  Nervous, Anxious, on Edge 1 1  Control/stop worrying 0 1  Worry too much - different things 1 1  Trouble relaxing 1 0  Restless 1 0  Easily annoyed or irritable 1 2  Afraid - awful might happen 0 0  Total GAD 7 Score 5 5  Anxiety Difficulty Not difficult at all Somewhat difficult   URINARY SYMPTOMS Started one week ago.  Started with burning.  Azo did not get rid of symptoms. Dysuria: burning Urinary frequency: no Urgency: no Small volume voids: yes Symptom severity: yes Urinary incontinence: when coughs or sneezes Foul odor: yes Hematuria: no Abdominal pain: no Back pain: no Suprapubic pain/pressure: no Flank pain: no Fever:  no Vomiting: no Relief with cranberry juice: no Relief with pyridium: no Status: stable Previous urinary tract infection: yes Recurrent urinary tract infection: no Sexual activity:monogamous History of sexually transmitted disease: no Treatments attempted: cranberry and increasing fluids    Relevant past medical, surgical, family and social history reviewed and updated as indicated.  Interim medical history since our last visit reviewed. Allergies and medications reviewed and updated.  Review of Systems  Constitutional:  Negative for activity change, appetite change, diaphoresis, fatigue and fever.  Respiratory:  Negative for cough, chest tightness and shortness of breath.   Cardiovascular:  Negative for chest pain, palpitations and leg swelling.  Gastrointestinal:  Negative for abdominal distention, abdominal pain, constipation, diarrhea, nausea and vomiting.  Genitourinary:  Positive for decreased urine volume, dysuria, frequency and urgency. Negative for hematuria and vaginal discharge.   Neurological: Negative.   Psychiatric/Behavioral: Negative.      Per HPI unless specifically indicated above     Objective:    BP 117/79   Pulse 93   Temp 97.7 F (36.5 C) (Oral)   Ht 5\' 10"  (1.778 m)   Wt 186 lb 3.2 oz (84.5 kg)   SpO2 97%   BMI 26.72 kg/m   Wt Readings from Last 3 Encounters:  07/25/23 186 lb 3.2 oz (84.5 kg)  01/23/23 185 lb (83.9 kg)  10/29/22 182 lb (82.6 kg)    Physical Exam Vitals and nursing note reviewed.  Constitutional:      General: She is awake. She is not in acute distress.    Appearance: She is well-developed and well-groomed. She is not ill-appearing or toxic-appearing.  HENT:     Head: Normocephalic.     Right Ear: Hearing and external ear normal.     Left Ear: Hearing and external ear normal.  Eyes:     General: Lids are normal.        Right eye: No discharge.        Left eye: No discharge.     Conjunctiva/sclera: Conjunctivae normal.     Pupils: Pupils are equal, round, and reactive to light.  Neck:     Thyroid: No thyromegaly.     Vascular: No carotid bruit.  Cardiovascular:     Rate and Rhythm: Normal rate and regular rhythm.     Heart sounds: Normal heart sounds. No murmur heard.    No gallop.  Pulmonary:     Effort: Pulmonary effort is normal. No accessory muscle usage or respiratory distress.     Breath sounds: Normal breath sounds.  Abdominal:     General: Bowel sounds are normal. There is no distension.     Palpations: Abdomen is soft.     Tenderness: There is no abdominal tenderness. There is no right CVA tenderness or left CVA tenderness.  Musculoskeletal:     Cervical back: Normal range of motion and neck supple.     Right lower leg: No edema.     Left lower leg: No edema.  Lymphadenopathy:     Cervical: No cervical adenopathy.  Skin:    General: Skin is warm and dry.  Neurological:     Mental Status: She is alert and oriented to person, place, and time.     Deep Tendon Reflexes: Reflexes are normal and  symmetric.     Reflex Scores:      Brachioradialis reflexes are 2+ on the right side and 2+ on the left side.      Patellar reflexes are 2+ on the right side and 2+ on the left side. Psychiatric:        Attention and Perception: Attention normal.        Mood and Affect: Mood normal.        Speech: Speech normal.        Behavior: Behavior normal. Behavior is cooperative.  Thought Content: Thought content normal.     Results for orders placed or performed in visit on 02/12/23  HM PAP SMEAR  Result Value Ref Range   HM Pap smear see report    HPV 16/18/45 genotyping Neg       Assessment & Plan:   Problem List Items Addressed This Visit       Other   ADD (attention deficit disorder) - Primary    Chronic, ongoing.  She has been followed by Dr. Evelene Croon in [place] for 5 years, recommend she maintain this relationship and visits with them.  Is currently on Valium as needed.  Would be beneficial to maintain psychiatry collaboration due to higher risk medication regimen.      Pain with urination    Acute for one week.  UA has some positives, will start Augmentin and send for culture.  Increase water intake at home and take Azo as needed.  Adjust regimen as needed based on culture.      Relevant Orders   Urinalysis, Routine w reflex microscopic   WET PREP FOR TRICH, YEAST, CLUE   Situational anxiety    Chronic, ongoing.  She has been followed by Dr. Evelene Croon in [place] for 5 years, recommend she maintain this relationship and visits with them.  Is currently on Valium as needed.  Would be beneficial to maintain psychiatry collaboration due to higher risk medication regimen.      Other Visit Diagnoses     Bacterial vaginosis       Acute, clue cells on wet prep.  She has had before. Educated on this.  Flagyl BID sent in.   Relevant Medications   metroNIDAZOLE (FLAGYL) 500 MG tablet   fluconazole (DIFLUCAN) 150 MG tablet   Pyuria       Urine sent for culture   Relevant Orders    Urine Culture        Follow up plan: Return in about 6 weeks (around 09/05/2023) for Annual Physical.

## 2023-07-28 DIAGNOSIS — J301 Allergic rhinitis due to pollen: Secondary | ICD-10-CM | POA: Diagnosis not present

## 2023-07-28 DIAGNOSIS — R8281 Pyuria: Secondary | ICD-10-CM | POA: Diagnosis not present

## 2023-07-30 NOTE — Progress Notes (Signed)
Contacted via MyChart   Definitely had infection, Augmentin should be clearing that up:)

## 2023-07-31 LAB — URINE CULTURE

## 2023-08-11 DIAGNOSIS — J301 Allergic rhinitis due to pollen: Secondary | ICD-10-CM | POA: Diagnosis not present

## 2023-08-21 DIAGNOSIS — J301 Allergic rhinitis due to pollen: Secondary | ICD-10-CM | POA: Diagnosis not present

## 2023-08-26 DIAGNOSIS — J301 Allergic rhinitis due to pollen: Secondary | ICD-10-CM | POA: Diagnosis not present

## 2023-08-28 DIAGNOSIS — J301 Allergic rhinitis due to pollen: Secondary | ICD-10-CM | POA: Diagnosis not present

## 2023-09-02 ENCOUNTER — Other Ambulatory Visit (INDEPENDENT_AMBULATORY_CARE_PROVIDER_SITE_OTHER): Payer: Self-pay

## 2023-09-02 ENCOUNTER — Ambulatory Visit (INDEPENDENT_AMBULATORY_CARE_PROVIDER_SITE_OTHER): Payer: BC Managed Care – PPO | Admitting: Physician Assistant

## 2023-09-02 DIAGNOSIS — M79642 Pain in left hand: Secondary | ICD-10-CM

## 2023-09-02 MED ORDER — COLCHICINE 0.6 MG PO TABS: ORAL_TABLET | ORAL | 6 refills | Status: DC

## 2023-09-02 MED ORDER — PREDNISONE 10 MG (21) PO TBPK
ORAL_TABLET | ORAL | 0 refills | Status: DC
Start: 2023-09-02 — End: 2023-09-23

## 2023-09-02 NOTE — Progress Notes (Signed)
 Office Visit Note   Patient: Brittney Alvarez           Date of Birth: Aug 07, 1971           MRN: [id number] Visit Date: 09/02/2023              Requested by: Valerio Melanie DASEN, NP [address] [place],  [place] [address] PCP: Valerio Melanie DASEN, NP   Assessment & Plan: Visit Diagnoses:  1. Pain in left hand     Plan: Impression is left ring finger PIP joint pain.  Etiology is uncertain but it sounds inflammatory.  No history of gout, however will obtain a uric acid today.  Will call her with the results.  Have sent in a steroid pack and colchicine .  Follow-up with us  as needed.  Follow-Up Instructions: Return if symptoms worsen or fail to improve.   Orders:  Orders Placed This Encounter  Procedures   XR Hand Complete Left   Meds ordered this encounter  Medications   predniSONE  (STERAPRED UNI-PAK 21 TAB) 10 MG (21) TBPK tablet    Sig: Take as directed    Dispense:  21 tablet    Refill:  0   colchicine  0.6 MG tablet    Sig: Take two pills day one and then one bill bid x no more than two days    Dispense:  30 tablet    Refill:  6      Procedures: No procedures performed   Clinical Data: No additional findings.   Subjective: Chief Complaint  Patient presents with   Left Hand - Pain    HPI patient is a pleasant 53 year old right-hand-dominant female who comes in today with pain and swelling to the PIP joint of the left ring finger.  Symptoms began 3 days ago when she awoke from a nap.  No injury or change in activity otherwise.  She tells me that her swelling has decreased.  She has constant discomfort but increased pain with flexion of the PIP joint.  She has been taking Advil  with some relief.  No history of gout.  Review of Systems as detailed in HPI.  All others reviewed and are negative.   Objective: Vital Signs: There were no vitals taken for this visit.  Physical Exam well-developed well-nourished female no acute distress.  Alert and oriented x 3.  Ortho Exam  left ring finger exam: Mild swelling.  No erythema or warmth.  She does have tenderness throughout the PIP joint.  Increased pain with flexion of the PIP joint.  She is neurovascularly intact distally.  Specialty Comments:  No specialty comments available.  Imaging: XR Hand Complete Left Result Date: 09/02/2023 Questionable vascular channel to the radial aspect of the proximal phalanx of the ring finger.  No other acute or structural abnormalities    PMFS History: Patient Active Problem List   Diagnosis Date Noted   Pain with urination 07/25/2023   Situational anxiety 01/23/2023   Environmental allergies 01/23/2023   Elevated low density lipoprotein (LDL) cholesterol level 01/13/2023   Microscopic colitis 01/13/2023   ADD (attention deficit disorder) 04/09/2019   Occipital neuralgia of left side 01/30/2018   Past Medical History:  Diagnosis Date   ADHD (attention deficit hyperactivity disorder)    Anxiety    Asthma    years ago   Seasonal allergies     Family History  Problem Relation Age of Onset   Hypertension Mother    Hypothyroidism Mother    Diabetes Father  Stomach cancer Neg Hx    Colon cancer Neg Hx    Esophageal cancer Neg Hx     Past Surgical History:  Procedure Laterality Date   ANKLE FUSION Left 12/07/2021   Procedure: LEFT LISFRANC FUSION;  Surgeon: Harden Jerona GAILS, MD;  Location: [facility] OR;  Service: Orthopedics;  Laterality: Left;   COLONOSCOPY  09/20/2022   FOOT SURGERY Left    bone spurs   Social History   Occupational History   Occupation: airline pilot  Tobacco Use   Smoking status: Former    Current packs/day: 0.00    Types: Cigarettes    Quit date: 10/2016    Years since quitting: 6.8   Smokeless tobacco: Never  Vaping Use   Vaping status: Never Used  Substance and Sexual Activity   Alcohol use: Not Currently   Drug use: Never   Sexual activity: Yes    Comment: husband has vasectemy

## 2023-09-02 NOTE — Addendum Note (Signed)
 Addended by: Hans Eden on: 09/02/2023 01:52 PM   Modules accepted: Orders

## 2023-09-03 LAB — EXTRA LAV TOP TUBE

## 2023-09-03 LAB — URIC ACID: Uric Acid, Serum: 3.2 mg/dL (ref 2.5–7.0)

## 2023-09-03 NOTE — Progress Notes (Signed)
Called and left message on machine.

## 2023-09-03 NOTE — Progress Notes (Signed)
 Can you let her know uric acid was normal

## 2023-09-04 DIAGNOSIS — J301 Allergic rhinitis due to pollen: Secondary | ICD-10-CM | POA: Diagnosis not present

## 2023-09-11 DIAGNOSIS — J301 Allergic rhinitis due to pollen: Secondary | ICD-10-CM | POA: Diagnosis not present

## 2023-09-18 DIAGNOSIS — J301 Allergic rhinitis due to pollen: Secondary | ICD-10-CM | POA: Diagnosis not present

## 2023-09-23 ENCOUNTER — Encounter: Payer: Self-pay | Admitting: Nurse Practitioner

## 2023-09-23 ENCOUNTER — Ambulatory Visit (INDEPENDENT_AMBULATORY_CARE_PROVIDER_SITE_OTHER): Payer: BC Managed Care – PPO | Admitting: Nurse Practitioner

## 2023-09-23 VITALS — BP 132/84 | HR 98 | Temp 97.5°F | Ht 69.8 in | Wt 189.2 lb

## 2023-09-23 DIAGNOSIS — E7800 Pure hypercholesterolemia, unspecified: Secondary | ICD-10-CM

## 2023-09-23 DIAGNOSIS — R8290 Unspecified abnormal findings in urine: Secondary | ICD-10-CM

## 2023-09-23 DIAGNOSIS — F5104 Psychophysiologic insomnia: Secondary | ICD-10-CM | POA: Diagnosis not present

## 2023-09-23 DIAGNOSIS — R8281 Pyuria: Secondary | ICD-10-CM

## 2023-09-23 DIAGNOSIS — R41840 Attention and concentration deficit: Secondary | ICD-10-CM | POA: Diagnosis not present

## 2023-09-23 DIAGNOSIS — F418 Other specified anxiety disorders: Secondary | ICD-10-CM

## 2023-09-23 DIAGNOSIS — G4700 Insomnia, unspecified: Secondary | ICD-10-CM | POA: Insufficient documentation

## 2023-09-23 DIAGNOSIS — Z0000 Encounter for general adult medical examination without abnormal findings: Secondary | ICD-10-CM

## 2023-09-23 DIAGNOSIS — Z1231 Encounter for screening mammogram for malignant neoplasm of breast: Secondary | ICD-10-CM | POA: Diagnosis not present

## 2023-09-23 LAB — MICROSCOPIC EXAMINATION

## 2023-09-23 LAB — URINALYSIS, ROUTINE W REFLEX MICROSCOPIC
Bilirubin, UA: NEGATIVE
Glucose, UA: NEGATIVE
Ketones, UA: NEGATIVE
Nitrite, UA: POSITIVE — AB
Protein,UA: NEGATIVE
RBC, UA: NEGATIVE
Specific Gravity, UA: 1.02 (ref 1.005–1.030)
Urobilinogen, Ur: 0.2 mg/dL (ref 0.2–1.0)
pH, UA: 6 (ref 5.0–7.5)

## 2023-09-23 MED ORDER — DIAZEPAM 5 MG PO TABS: 5.0000 mg | ORAL_TABLET | Freq: Four times a day (QID) | ORAL | 0 refills | Status: AC | PRN

## 2023-09-23 MED ORDER — TRAZODONE HCL 50 MG PO TABS: 25.0000 mg | ORAL_TABLET | Freq: Every evening | ORAL | 3 refills | Status: DC | PRN

## 2023-09-23 MED ORDER — NITROFURANTOIN MONOHYD MACRO 100 MG PO CAPS: 100.0000 mg | ORAL_CAPSULE | Freq: Two times a day (BID) | ORAL | 0 refills | Status: AC

## 2023-09-23 NOTE — Assessment & Plan Note (Signed)
 Chronic, ongoing.  She has been followed by Dr. Vincente in [place] for 5 years, recommend she maintain this relationship and visits with them.  Is currently on Valium  as needed.  Would be beneficial to maintain psychiatry collaboration due to higher risk medication regimen, recommend she schedule follow-up.

## 2023-09-23 NOTE — Assessment & Plan Note (Addendum)
 Noted on past labs, is fasting this morning.  Check labs and initiate medication in future if needed. The 10-year ASCVD risk score (Arnett DK, et al., 2019) is: 3.5%   Values used to calculate the score:     Age: 53 years     Sex: Female     Is Non-Hispanic African American: No     Diabetic: No     Tobacco smoker: Yes     Systolic Blood Pressure: 132 mmHg     Is BP treated: No     HDL Cholesterol: 58 mg/dL     Total Cholesterol: 192 mg/dL

## 2023-09-23 NOTE — Assessment & Plan Note (Signed)
Ongoing with current stressors.  Will start Trazodone 25-50 MG at night as needed.  Educated her on this regimen for sleep.  May continue Melatonin only as needed.  Continue to collaborate with psychiatry.

## 2023-09-23 NOTE — Addendum Note (Signed)
Addended by: Aura Dials T on: 09/23/2023 02:26 PM   Modules accepted: Orders

## 2023-09-23 NOTE — Addendum Note (Signed)
Addended by: Aura Dials T on: 09/23/2023 01:59 PM   Modules accepted: Orders

## 2023-09-23 NOTE — Addendum Note (Signed)
Addended by: Aura Dials T on: 09/23/2023 02:25 PM   Modules accepted: Orders

## 2023-09-23 NOTE — Patient Instructions (Signed)
 Please call to schedule your mammogram and/or bone density: [facility] at [facility]  Address: [address], [place], [place] [address] Phone: [phone]  [facility] [address] [place],  [place]  [address] Phone: [phone]

## 2023-09-23 NOTE — Progress Notes (Signed)
 BP 132/84   Pulse 98   Temp (!) 97.5 F (36.4 C) (Oral)   Ht 5' 9.8 (1.773 m)   Wt 189 lb 3.2 oz (85.8 kg)   SpO2 94%   BMI 27.30 kg/m    Subjective:    Patient ID: Brittney Alvarez, female    DOB: 01/29/1971, 53 y.o.   MRN: [id number]  HPI: Brittney Alvarez is a 53 y.o. female presenting on 23-Oct-2023 for comprehensive medical examination. Current medical complaints include:none  She currently lives with: spouse -- currently separating Menopausal Symptoms: no  ANXIETY & ADHD Diagnosed with ADHD about 5 years ago. Sees Dr. Vincente in [place] for this, psychiatry.  Has been with her for 5 years, but not seen in one year.  Was taking Adderall and this caused irritability, they discussed Vyvanse but too costly..Uses Valium  for anxiety as needed, ordered by psychiatry -- one bottle may last 6 months or more.  Used for first time in a long time yesterday.  In past she took Lexapro for mood, but this reduced sexual drive.  Works for [employer] in [place], currently this is stable.  Main stressor is her separation at present, husband still lives in home. Duration:stable Anxious mood: yes Excessive worrying: yes Irritability: a little Sweating: no Nausea: no Palpitations:no Hyperventilation: no Panic attacks: no Agoraphobia: no  Obscessions/compulsions: no Depressed mood: sometimes    10/23/23    9:36 AM 07/25/2023    8:44 AM 01/23/2023    8:20 AM  Depression screen PHQ 2/9  Decreased Interest 1 0 1  Down, Depressed, Hopeless 1 1 1   PHQ - 2 Score 2 1 2   Altered sleeping 1 3 1   Tired, decreased energy 1 1 1   Change in appetite 2 1 1   Feeling bad or failure about yourself  2 0 0  Trouble concentrating 1 2 1   Moving slowly or fidgety/restless 0 1 0  Suicidal thoughts 0 0 0  PHQ-9 Score 9 9 6   Difficult doing work/chores Very difficult Not difficult at all Somewhat difficult  Anhedonia: yes Weight changes: no Insomnia: yes hard to stay asleep  -- takes Melatonin Hypersomnia:  no Fatigue/loss of energy: yes Feelings of worthlessness: no Feelings of guilt: no Impaired concentration/indecisiveness: yes Suicidal ideations: no  Crying spells: yes Recent Stressors/Life Changes: yes   Relationship problems: yes, separating from husband   Family stress: no     Financial stress: no    Job stress: no    Recent death/loss: no     10/23/23    9:36 AM 07/25/2023    8:44 AM 01/23/2023    8:20 AM  GAD 7 : Generalized Anxiety Score  Nervous, Anxious, on Edge 3 1 1   Control/stop worrying 2 0 1  Worry too much - different things 2 1 1   Trouble relaxing 3 1 0  Restless 2 1 0  Easily annoyed or irritable 2 1 2   Afraid - awful might happen 1 0 0  Total GAD 7 Score 15 5 5   Anxiety Difficulty Very difficult Not difficult at all Somewhat difficult      12/07/2021    9:06 AM 06/14/2022    4:45 PM 01/23/2023    8:20 AM 07/25/2023    8:44 AM 23-Oct-2023    9:35 AM  Fall Risk  Falls in the past year?   0 0 0  Was there an injury with Fall?   0 0 0  Fall Risk Category Calculator   0 0 0  (  RETIRED) Patient Fall Risk Level Moderate fall risk Low fall risk     Patient at Risk for Falls Due to   No Fall Risks No Fall Risks No Fall Risks  Fall risk Follow up   Falls evaluation completed Falls evaluation completed Falls evaluation completed    Functional Status Survey: Is the patient deaf or have difficulty hearing?: No Does the patient have difficulty seeing, even when wearing glasses/contacts?: No Does the patient have difficulty concentrating, remembering, or making decisions?: No Does the patient have difficulty walking or climbing stairs?: No Does the patient have difficulty dressing or bathing?: No Does the patient have difficulty doing errands alone such as visiting a doctor's office or shopping?: No   Past Medical History:  Past Medical History:  Diagnosis Date   ADHD (attention deficit hyperactivity disorder)    Anxiety    Asthma    years ago   Seasonal allergies      Surgical History:  Past Surgical History:  Procedure Laterality Date   ANKLE FUSION Left 12/07/2021   Procedure: LEFT LISFRANC FUSION;  Surgeon: Harden Jerona GAILS, MD;  Location: [facility];  Service: Orthopedics;  Laterality: Left;   COLONOSCOPY  09/20/2022   FOOT SURGERY Left    bone spurs    Medications:  Current Outpatient Medications on File Prior to Visit  Medication Sig   albuterol (VENTOLIN HFA) 108 (90 Base) MCG/ACT inhaler    cetirizine (ZYRTEC) 10 MG tablet Take 10 mg by mouth 2 (two) times daily.   EPINEPHrine 0.3 mg/0.3 mL IJ SOAJ injection SMARTSIG:1 Pre-Filled Pen Syringe IM Once   Melatonin 10 MG CAPS Take by mouth at bedtime.   No current facility-administered medications on file prior to visit.    Allergies:  Allergies  Allergen Reactions   Cheese    Malt    Milk-Related Compounds     Social History:  Social History   Socioeconomic History   Marital status: Married    Spouse name: Not on file   Number of children: 2   Years of education: Not on file   Highest education level: Not on file  Occupational History   Occupation: accountant  Tobacco Use   Smoking status: Some Days    Current packs/day: 0.00    Types: Cigarettes    Last attempt to quit: 10/2016    Years since quitting: 6.9   Smokeless tobacco: Never  Vaping Use   Vaping status: Never Used  Substance and Sexual Activity   Alcohol use: Not Currently   Drug use: Never   Sexual activity: Yes    Comment: husband has vasectemy  Other Topics Concern   Not on file  Social History Narrative   Not on file   Social Drivers of Health   Financial Resource Strain: Low Risk  (01/23/2023)   Overall Financial Resource Strain (CARDIA)    Difficulty of Paying Living Expenses: Not hard at all  Food Insecurity: No Food Insecurity (01/23/2023)   Hunger Vital Sign    Worried About Running Out of Food in the Last Year: Never true    Ran Out of Food in the Last Year: Never true  Transportation Needs: No  Transportation Needs (01/23/2023)   PRAPARE - Administrator, Civil Service (Medical): No    Lack of Transportation (Non-Medical): No  Physical Activity: Inactive (01/23/2023)   Exercise Vital Sign    Days of Exercise per Week: 0 days    Minutes of Exercise per Session: 0 min  Stress: Stress Concern Present (01/23/2023)   [employer] of Occupational Health - Occupational Stress Questionnaire    Feeling of Stress : Rather much  Social Connections: Moderately Isolated (01/23/2023)   Social Connection and Isolation Panel [NHANES]    Frequency of Communication with Friends and Family: More than three times a week    Frequency of Social Gatherings with Friends and Family: More than three times a week    Attends Religious Services: Never    Database Administrator or Organizations: No    Attends Banker Meetings: Never    Marital Status: Married  Catering Manager Violence: Not At Risk (01/23/2023)   Humiliation, Afraid, Rape, and Kick questionnaire    Fear of Current or Ex-Partner: No    Emotionally Abused: No    Physically Abused: No    Sexually Abused: No   Social History   Tobacco Use  Smoking Status Some Days   Current packs/day: 0.00   Types: Cigarettes   Last attempt to quit: 10/2016   Years since quitting: 6.9  Smokeless Tobacco Never   Social History   Substance and Sexual Activity  Alcohol Use Not Currently    Family History:  Family History  Problem Relation Age of Onset   Hypertension Mother    Hypothyroidism Mother    Diabetes Father    Stomach cancer Neg Hx    Colon cancer Neg Hx    Esophageal cancer Neg Hx     Past medical history, surgical history, medications, allergies, family history and social history reviewed with patient today and changes made to appropriate areas of the chart.   ROS All other ROS negative except what is listed above and in the HPI.      Objective:    BP 132/84   Pulse 98   Temp (!) 97.5 F (36.4 C)  (Oral)   Ht 5' 9.8 (1.773 m)   Wt 189 lb 3.2 oz (85.8 kg)   SpO2 94%   BMI 27.30 kg/m   Wt Readings from Last 3 Encounters:  09/23/23 189 lb 3.2 oz (85.8 kg)  07/25/23 186 lb 3.2 oz (84.5 kg)  01/23/23 185 lb (83.9 kg)    Physical Exam Vitals and nursing note reviewed. Exam conducted with a chaperone present.  Constitutional:      General: She is awake. She is not in acute distress.    Appearance: She is well-developed and well-groomed. She is not ill-appearing or toxic-appearing.  HENT:     Head: Normocephalic and atraumatic.     Right Ear: Hearing, tympanic membrane, ear canal and external ear normal. No drainage.     Left Ear: Hearing, tympanic membrane, ear canal and external ear normal. No drainage.     Nose: Nose normal.     Right Sinus: No maxillary sinus tenderness or frontal sinus tenderness.     Left Sinus: No maxillary sinus tenderness or frontal sinus tenderness.     Mouth/Throat:     Mouth: Mucous membranes are moist.     Pharynx: Oropharynx is clear. Uvula midline. No pharyngeal swelling, oropharyngeal exudate or posterior oropharyngeal erythema.  Eyes:     General: Lids are normal.        Right eye: No discharge.        Left eye: No discharge.     Extraocular Movements: Extraocular movements intact.     Conjunctiva/sclera: Conjunctivae normal.     Pupils: Pupils are equal, round, and reactive to light.  Visual Fields: Right eye visual fields normal and left eye visual fields normal.  Neck:     Thyroid : No thyromegaly.     Vascular: No carotid bruit.     Trachea: Trachea normal.  Cardiovascular:     Rate and Rhythm: Normal rate and regular rhythm.     Heart sounds: Normal heart sounds. No murmur heard.    No gallop.  Pulmonary:     Effort: Pulmonary effort is normal. No accessory muscle usage or respiratory distress.     Breath sounds: Normal breath sounds.  Chest:  Breasts:    Right: Normal.     Left: Normal.  Abdominal:     General: Bowel sounds  are normal.     Palpations: Abdomen is soft. There is no hepatomegaly or splenomegaly.     Tenderness: There is no abdominal tenderness.  Musculoskeletal:        General: Normal range of motion.     Cervical back: Normal range of motion and neck supple.     Right lower leg: No edema.     Left lower leg: No edema.  Lymphadenopathy:     Head:     Right side of head: No submental, submandibular, tonsillar, preauricular or posterior auricular adenopathy.     Left side of head: No submental, submandibular, tonsillar, preauricular or posterior auricular adenopathy.     Cervical: No cervical adenopathy.     Upper Body:     Right upper body: No supraclavicular, axillary or pectoral adenopathy.     Left upper body: No supraclavicular, axillary or pectoral adenopathy.  Skin:    General: Skin is warm and dry.     Capillary Refill: Capillary refill takes less than 2 seconds.     Findings: No rash.  Neurological:     Mental Status: She is alert and oriented to person, place, and time.     Gait: Gait is intact.     Deep Tendon Reflexes: Reflexes are normal and symmetric.     Reflex Scores:      Brachioradialis reflexes are 2+ on the right side and 2+ on the left side.      Patellar reflexes are 2+ on the right side and 2+ on the left side. Psychiatric:        Attention and Perception: Attention normal.        Mood and Affect: Mood normal.        Speech: Speech normal.        Behavior: Behavior normal. Behavior is cooperative.        Thought Content: Thought content normal.        Judgment: Judgment normal.     Results for orders placed or performed in visit on 09/02/23  Uric acid   Collection Time: 09/02/23  1:53 PM  Result Value Ref Range   Uric Acid, Serum 3.2 2.5 - 7.0 mg/dL  EXTRA LAV TOP TUBE   Collection Time: 09/02/23  1:53 PM  Result Value Ref Range   EXTRA LAVENDER-TOP TUBE        Assessment & Plan:   Problem List Items Addressed This Visit       Other   ADD  (attention deficit disorder)   Chronic, ongoing.  She has been followed by Dr. Vincente in [place] for 5 years, recommend she maintain this relationship and visits with them.  Is currently on Valium  as needed.  Would be beneficial to maintain psychiatry collaboration due to higher risk medication regimen, recommend she schedule  follow-up.      Elevated low density lipoprotein (LDL) cholesterol level   Noted on past labs, is fasting this morning.  Check labs and initiate medication in future if needed. The 10-year ASCVD risk score (Arnett DK, et al., 2019) is: 3.5%   Values used to calculate the score:     Age: 64 years     Sex: Female     Is Non-Hispanic African American: No     Diabetic: No     Tobacco smoker: Yes     Systolic Blood Pressure: 132 mmHg     Is BP treated: No     HDL Cholesterol: 58 mg/dL     Total Cholesterol: 192 mg/dL       Relevant Orders   Comprehensive metabolic panel   Lipid Panel w/o Chol/HDL Ratio   Insomnia   Ongoing with current stressors.  Will start Trazodone  25-50 MG at night as needed.  Educated her on this regimen for sleep.  May continue Melatonin only as needed.  Continue to collaborate with psychiatry.      Situational anxiety - Primary   Chronic, exacerbated by separation.  She has been followed by Dr. Vincente in [place] for 5 years, recommend she maintain this relationship and visits with them.  Is currently on Valium  as needed.  Would be beneficial to maintain psychiatry collaboration due to higher risk medication regimen, recommend she schedule follow-up with them.  Will send in 30 tablets of Valium  to last until sees them.      Relevant Medications   traZODone  (DESYREL ) 50 MG tablet   diazepam  (VALIUM ) 5 MG tablet   Other Relevant Orders   CBC with Differential/Platelet   TSH   Other Visit Diagnoses       Abnormal urine odor       Check urine today and treat as needed.   Relevant Orders   Urinalysis, Routine w reflex microscopic      Encounter for screening mammogram for malignant neoplasm of breast       Mammogram ordered and instructed how to schedule.   Relevant Orders   MM 3D SCREENING MAMMOGRAM BILATERAL BREAST     Encounter for annual physical exam       Annual physical, health maintenance reviewed with patient.        Follow up plan: Return in about 6 months (around 03/22/2024) for Depression, ANXIETY.   LABORATORY TESTING:  - Pap smear: up to date  IMMUNIZATIONS:   - Tdap: Tetanus vaccination status reviewed: last tetanus booster within 10 years. - Influenza: Up to date - Pneumovax: Not applicable - Prevnar: Not applicable - COVID: Up to date - HPV: Not applicable - Shingrix  vaccine: has had one, will get second one  SCREENING: -Mammogram: Ordered today  - Colonoscopy: Up to date  - Bone Density: Not applicable  -Hearing Test: Not applicable  -Spirometry: Not applicable   PATIENT COUNSELING:   Advised to take 1 mg of folate supplement per day if capable of pregnancy.   Sexuality: Discussed sexually transmitted diseases, partner selection, use of condoms, avoidance of unintended pregnancy  and contraceptive alternatives.   Advised to avoid cigarette smoking.  I discussed with the patient that most people either abstain from alcohol or drink within safe limits (<=14/week and <=4 drinks/occasion for males, <=7/weeks and <= 3 drinks/occasion for females) and that the risk for alcohol disorders and other health effects rises proportionally with the number of drinks per week and how often a drinker exceeds  daily limits.  Discussed cessation/primary prevention of drug use and availability of treatment for abuse.   Diet: Encouraged to adjust caloric intake to maintain  or achieve ideal body weight, to reduce intake of dietary saturated fat and total fat, to limit sodium intake by avoiding high sodium foods and not adding table salt, and to maintain adequate dietary potassium and calcium preferably from  fresh fruits, vegetables, and low-fat dairy products.    Stressed the importance of regular exercise  Injury prevention: Discussed safety belts, safety helmets, smoke detector, smoking near bedding or upholstery.   Dental health: Discussed importance of regular tooth brushing, flossing, and dental visits.    NEXT PREVENTATIVE PHYSICAL DUE IN 1 YEAR. Return in about 6 months (around 03/22/2024) for Depression, ANXIETY.

## 2023-09-23 NOTE — Assessment & Plan Note (Signed)
 Chronic, exacerbated by separation.  She has been followed by Dr. Vincente in [place] for 5 years, recommend she maintain this relationship and visits with them.  Is currently on Valium  as needed.  Would be beneficial to maintain psychiatry collaboration due to higher risk medication regimen, recommend she schedule follow-up with them.  Will send in 30 tablets of Valium  to last until sees them.

## 2023-09-24 ENCOUNTER — Encounter: Payer: Self-pay | Admitting: Nurse Practitioner

## 2023-09-24 LAB — COMPREHENSIVE METABOLIC PANEL
ALT: 36 [IU]/L — ABNORMAL HIGH (ref 0–32)
AST: 20 [IU]/L (ref 0–40)
Albumin: 4.1 g/dL (ref 3.8–4.9)
Alkaline Phosphatase: 106 [IU]/L (ref 44–121)
BUN/Creatinine Ratio: 13 (ref 9–23)
BUN: 11 mg/dL (ref 6–24)
Bilirubin Total: 0.3 mg/dL (ref 0.0–1.2)
CO2: 26 mmol/L (ref 20–29)
Calcium: 9.7 mg/dL (ref 8.7–10.2)
Chloride: 102 mmol/L (ref 96–106)
Creatinine, Ser: 0.87 mg/dL (ref 0.57–1.00)
Globulin, Total: 3 g/dL (ref 1.5–4.5)
Glucose: 76 mg/dL (ref 70–99)
Potassium: 4.4 mmol/L (ref 3.5–5.2)
Sodium: 142 mmol/L (ref 134–144)
Total Protein: 7.1 g/dL (ref 6.0–8.5)
eGFR: 81 mL/min/{1.73_m2} (ref >59)

## 2023-09-24 LAB — CBC WITH DIFFERENTIAL/PLATELET
Basophils Absolute: 0 10*3/uL (ref 0.0–0.2)
Basos: 1 %
EOS (ABSOLUTE): 0.3 10*3/uL (ref 0.0–0.4)
Eos: 5 %
Hematocrit: 45.5 % (ref 34.0–46.6)
Hemoglobin: 14.8 g/dL (ref 11.1–15.9)
Immature Grans (Abs): 0 10*3/uL (ref 0.0–0.1)
Immature Granulocytes: 0 %
Lymphocytes Absolute: 1.6 10*3/uL (ref 0.7–3.1)
Lymphs: 29 %
MCH: 27.8 pg (ref 26.6–33.0)
MCHC: 32.5 g/dL (ref 31.5–35.7)
MCV: 86 fL (ref 79–97)
Monocytes Absolute: 0.6 10*3/uL (ref 0.1–0.9)
Monocytes: 11 %
Neutrophils Absolute: 3 10*3/uL (ref 1.4–7.0)
Neutrophils: 54 %
Platelets: 265 10*3/uL (ref 150–450)
RBC: 5.32 x10E6/uL — ABNORMAL HIGH (ref 3.77–5.28)
RDW: 12.7 % (ref 11.7–15.4)
WBC: 5.5 10*3/uL (ref 3.4–10.8)

## 2023-09-24 LAB — LIPID PANEL W/O CHOL/HDL RATIO
Cholesterol, Total: 198 mg/dL (ref 100–199)
HDL: 61 mg/dL (ref >39)
LDL Chol Calc (NIH): 126 mg/dL — ABNORMAL HIGH (ref 0–99)
Triglycerides: 60 mg/dL (ref 0–149)
VLDL Cholesterol Cal: 11 mg/dL (ref 5–40)

## 2023-09-24 LAB — TSH: TSH: 0.793 u[IU]/mL (ref 0.450–4.500)

## 2023-09-24 NOTE — Progress Notes (Signed)
 Contacted via MyChart   Good evening Brittney Alvarez, your blood work has returned: - CBC shows no anemia or infection - Kidney function, creatinine and eGFR, remains stable.  Liver function shows very mild elevation in ALT, but AST is normal.  We will continue to monitor. - Thyroid  lab is normal. - Your LDL is above normal. The LDL is the bad cholesterol. Over time and in combination with inflammation and other factors, this contributes to plaque which in turn may lead to stroke and/or heart attack down the road. Sometimes high LDL is primarily genetic, and people might be eating all the right foods but still have high numbers. Other times, there is room for improvement in one's diet and eating healthier can bring this number down and potentially reduce one's risk of heart attack and/or stroke. To reduce your LDL, Remember - more fruits and vegetables, more fish, and limit red meat and dairy products. More soy, nuts, beans, barley, lentils, oats and plant sterol ester enriched margarine instead of butter. I also encourage eliminating sugar and processed food. Remember, shop on the outside of the grocery store and visit your [employer]. Any questions? Keep being amazing!!  Thank you for allowing me to participate in your care.  I appreciate you. Kindest regards, Brittney Alvarez

## 2023-09-25 DIAGNOSIS — J301 Allergic rhinitis due to pollen: Secondary | ICD-10-CM | POA: Diagnosis not present

## 2023-09-26 LAB — URINE CULTURE

## 2023-09-26 NOTE — Progress Notes (Signed)
 Good evening Brittney Alvarez, your urine infection is susceptible to Macrobid .  Good news!!

## 2023-10-02 DIAGNOSIS — J301 Allergic rhinitis due to pollen: Secondary | ICD-10-CM | POA: Diagnosis not present

## 2023-10-13 DIAGNOSIS — J301 Allergic rhinitis due to pollen: Secondary | ICD-10-CM | POA: Diagnosis not present

## 2023-10-23 DIAGNOSIS — F4322 Adjustment disorder with anxiety: Secondary | ICD-10-CM | POA: Diagnosis not present

## 2023-10-23 DIAGNOSIS — F900 Attention-deficit hyperactivity disorder, predominantly inattentive type: Secondary | ICD-10-CM | POA: Diagnosis not present

## 2023-10-23 DIAGNOSIS — F411 Generalized anxiety disorder: Secondary | ICD-10-CM | POA: Diagnosis not present

## 2023-10-23 DIAGNOSIS — F3342 Major depressive disorder, recurrent, in full remission: Secondary | ICD-10-CM | POA: Diagnosis not present

## 2023-10-23 DIAGNOSIS — J301 Allergic rhinitis due to pollen: Secondary | ICD-10-CM | POA: Diagnosis not present

## 2023-10-30 DIAGNOSIS — J301 Allergic rhinitis due to pollen: Secondary | ICD-10-CM | POA: Diagnosis not present

## 2023-11-06 DIAGNOSIS — J301 Allergic rhinitis due to pollen: Secondary | ICD-10-CM | POA: Diagnosis not present

## 2023-11-07 DIAGNOSIS — J301 Allergic rhinitis due to pollen: Secondary | ICD-10-CM | POA: Diagnosis not present

## 2023-11-20 DIAGNOSIS — J301 Allergic rhinitis due to pollen: Secondary | ICD-10-CM | POA: Diagnosis not present

## 2023-11-24 ENCOUNTER — Encounter: Payer: Self-pay | Admitting: Nurse Practitioner

## 2023-11-24 DIAGNOSIS — F418 Other specified anxiety disorders: Secondary | ICD-10-CM

## 2023-11-27 DIAGNOSIS — J301 Allergic rhinitis due to pollen: Secondary | ICD-10-CM | POA: Diagnosis not present

## 2023-12-04 DIAGNOSIS — J301 Allergic rhinitis due to pollen: Secondary | ICD-10-CM | POA: Diagnosis not present

## 2023-12-04 DIAGNOSIS — F411 Generalized anxiety disorder: Secondary | ICD-10-CM | POA: Diagnosis not present

## 2023-12-15 DIAGNOSIS — J301 Allergic rhinitis due to pollen: Secondary | ICD-10-CM | POA: Diagnosis not present

## 2023-12-16 DIAGNOSIS — F411 Generalized anxiety disorder: Secondary | ICD-10-CM | POA: Diagnosis not present

## 2023-12-18 DIAGNOSIS — H04123 Dry eye syndrome of bilateral lacrimal glands: Secondary | ICD-10-CM | POA: Diagnosis not present

## 2023-12-18 DIAGNOSIS — H5213 Myopia, bilateral: Secondary | ICD-10-CM | POA: Diagnosis not present

## 2023-12-23 DIAGNOSIS — F411 Generalized anxiety disorder: Secondary | ICD-10-CM | POA: Diagnosis not present

## 2023-12-29 ENCOUNTER — Encounter: Payer: Self-pay | Admitting: Nurse Practitioner

## 2023-12-29 DIAGNOSIS — J301 Allergic rhinitis due to pollen: Secondary | ICD-10-CM | POA: Diagnosis not present

## 2023-12-30 DIAGNOSIS — F411 Generalized anxiety disorder: Secondary | ICD-10-CM | POA: Diagnosis not present

## 2024-01-05 DIAGNOSIS — J301 Allergic rhinitis due to pollen: Secondary | ICD-10-CM | POA: Diagnosis not present

## 2024-01-07 DIAGNOSIS — F411 Generalized anxiety disorder: Secondary | ICD-10-CM | POA: Diagnosis not present

## 2024-01-09 ENCOUNTER — Telehealth (INDEPENDENT_AMBULATORY_CARE_PROVIDER_SITE_OTHER): Admitting: Nurse Practitioner

## 2024-01-09 ENCOUNTER — Encounter: Payer: Self-pay | Admitting: Nurse Practitioner

## 2024-01-09 DIAGNOSIS — N760 Acute vaginitis: Secondary | ICD-10-CM

## 2024-01-09 DIAGNOSIS — B9689 Other specified bacterial agents as the cause of diseases classified elsewhere: Secondary | ICD-10-CM | POA: Diagnosis not present

## 2024-01-09 DIAGNOSIS — R21 Rash and other nonspecific skin eruption: Secondary | ICD-10-CM | POA: Insufficient documentation

## 2024-01-09 MED ORDER — ONDANSETRON 4 MG PO TBDP: 4.0000 mg | ORAL_TABLET | Freq: Three times a day (TID) | ORAL | 0 refills | Status: DC | PRN

## 2024-01-09 MED ORDER — METRONIDAZOLE 500 MG PO TABS: 500.0000 mg | ORAL_TABLET | Freq: Two times a day (BID) | ORAL | 0 refills | Status: AC

## 2024-01-09 MED ORDER — SCOPOLAMINE 1 MG/3DAYS TD PT72: 1.0000 | MEDICATED_PATCH | TRANSDERMAL | 12 refills | Status: DC

## 2024-01-09 MED ORDER — METHYLPREDNISOLONE 4 MG PO TBPK: ORAL_TABLET | ORAL | 0 refills | Status: DC

## 2024-01-09 NOTE — Assessment & Plan Note (Signed)
 Acute, similar to recent infection.  [place].  Will start Flagyl  BID for 7 days, she has taken before with benefit and is aware not to drink alcohol while taking.

## 2024-01-09 NOTE — Patient Instructions (Signed)
 Sun Sensitivity Sun sensitivity, also called photosensitivity, is a condition in which exposure to sunlight causes skin irritation. There are many types of sun sensitivity. Some medicines, skin products, and medical conditions can cause sun sensitivity that can range from mild to severe. If medicines or products are causing sun sensitivity, the condition may go away after you stop taking the medicine or using the product. In some cases, sun sensitivity is a long-term (chronic) condition. What are the causes? Common causes of sun sensitivity include: Certain medicines. Certain makeup products (cosmetics), lotions, or medicines that are applied to the skin. Some health conditions, including: Connective tissue diseases, such as lupus. Redness and inflammation of the face or eyes (rosacea). Acord patches of skin (vitiligo). In some cases, sun sensitivity may be passed from parent to child (inherited), or the cause may not be known. What increases the risk? This condition is more likely to develop in people who: Have family members who have sun sensitivity. Are in sunlight for long periods of time. What are the signs or symptoms? Symptoms of sun sensitivity vary. Common symptoms include: Red or sunburned skin that may be bumpy or form blisters. Hives. Itchy skin (pruritus). Pain where skin is exposed to the sun. Dry, flaky skin. Swelling of the skin. Sunburn or other symptoms often develop after a short amount of sun exposure. How is this diagnosed? This condition is diagnosed based on your symptoms. It is important to talk with your health care provider about your symptoms, including: The timing of your symptoms, such as whether you have symptoms immediately after being in the sun, or after a long period of being in the sun. How long your symptoms last. Where your symptoms occur. Some types of sun sensitivity can be diagnosed with: A skin biopsy. This a test that involves taking a small  sample of skin to be examined under a microscope. A blood test to check for certain medical conditions or genetic factors. Light testing to see how you react to UV (ultraviolet) light. This is the type of light that comes from the sun. How is this treated? This condition is often treated by preventing known causes of sun sensitivity. This may include protecting your skin from the sun and avoiding certain skin products. Your health care provider may recommend that you take an antihistamine medicine or apply prescription cream to affected areas to help relieve symptoms. Follow these instructions at home: Do not break any blisters that you may have. Do not scratch your skin while you have symptoms. This can make symptoms worse. Apply a cool, wet cloth (cool compress) to affected areas. This may help to relieve symptoms. Do not apply ice to your affected skin. Icing can cause further damage. Take over-the-counter and prescription medicines only as told by your health care provider. How is this prevented?  Avoid skin products that cause symptoms, such as certain cosmetics and lotions. Avoid being in the sun while taking medicines that are known to cause sun sensitivity. Ask your health care provider if any medicines that you take may cause sun sensitivity. Avoid the sun when it is strongest, usually between 10 a.m. and 4 p.m. Cover your skin with pants, long sleeves, and a hat when you are exposed to sunlight. Apply sunscreen 15-30 minutes before you go outside, even on cloudy days. Use a sunscreen with an SPF of 30 or higher. Use a waterproof or water-resistant sunscreen that protects against all of the sun's rays (broad-spectrum). Apply a thick coat of sunscreen  over all exposed skin. Typically, a palm-size amount of sunscreen will cover your whole body. Do not use sunscreen on a baby who is younger than 78 months of age. Reapply sunscreen: About every 2 hours, whether it is sunny or cloudy. More  often if you are sweating a lot. After swimming or playing in the water. Contact a health care provider if: Your symptoms do not improve with treatment. Your rash does not go away. Your pain or itching is not controlled with medicine. Your skin becomes more painful and swollen. You have a fever. You have open blisters. Get help right away if: You suddenly develop a rash as well as: Swelling around your eyes or lips. Trouble swallowing. Trouble breathing. You have bleeding underneath skin that was exposed to the sun. You have pus or fluid coming from any blisters. These symptoms may represent a serious problem that is an emergency. Do not wait to see if the symptoms will go away. Get medical help right away. Call your local emergency services (911 in the [place] Do not drive yourself to the hospital. Summary Sun sensitivity, also called photosensitivity, is a condition in which exposure to sunlight causes skin irritation. Some medicines, skin products, and medical conditions can cause sun sensitivity. Avoid being in the sun while taking medicines that are known to cause sun sensitivity. Apply sunscreen 15-30 minutes before you go outside, even on cloudy days. Reapply sunscreen as directed. This information is not intended to replace advice given to you by your health care provider. Make sure you discuss any questions you have with your health care provider. Document Revised: 11/08/2020 Document Reviewed: 11/08/2020 Elsevier Patient Education  2024 [employer].

## 2024-01-09 NOTE — Progress Notes (Signed)
 There were no vitals taken for this visit.   Subjective:    Patient ID: Brittney Alvarez, female    DOB: 1971/03/25, 53 y.o.   MRN: [id number]  HPI: Brittney Alvarez is a 53 y.o. female  Chief Complaint  Patient presents with   Vaginal Discharge    Patient states she has been having vaginal discharge and an odor for the last week    Rash    Patient states she has been noticing a rash on her arms after being exposed to the sun. States she has tried multiple OTC creams, prescription meds, and allergy medications.    Virtual Visit via Video Note  I connected with Brittney Alvarez on 01/09/24 at 11:20 AM EDT by a video enabled telemedicine application and verified that I am speaking with the correct person using two identifiers.  Location: Patient: home Provider: work   I discussed the limitations of evaluation and management by telemedicine and the availability of in person appointments. The patient expressed understanding and agreed to proceed.  I discussed the assessment and treatment plan with the patient. The patient was provided an opportunity to ask questions and all were answered. The patient agreed with the plan and demonstrated an understanding of the instructions.   The patient was advised to call back or seek an in-person evaluation if the symptoms worsen or if the condition fails to improve as anticipated.  I provided 25 minutes of non-face-to-face time during this encounter.   Abdullah Rizzi T Carle Dargan, NP   VAGINAL DISCHARGE Last week started, last BV infection was on 09/23/23. Duration: weeks Discharge description: mucous  Pruritus: no Dysuria: no Malodorous: yes Urinary frequency: no Fevers: no Abdominal pain: no  Sexual activity: monogamous History of sexually transmitted diseases: no Recent antibiotic use: no Context:recurrent BV  Treatments attempted: probiotics   RASH For the past few years has had a rash when went out in the sun.  She went to dermatology who thought  it was medication related, so she stopped medications but still having rash to arms after sun exposure.  Has tried multiple OTC creams, steroid cream and a cream dermatology gave her.  Tried mineral sun screen without benefit. Takes allergy medication without benefit. Duration:  chronic  Location: arms  Itching: yes if touch it Burning: no Redness: yes bumps Oozing: no Scaling: no Blisters: no Painful: no Fevers: no Change in detergents/soaps/personal care products: no Recent illness: no Recent travel:no History of same: yes Context: fluctuating Alleviating factors: nothing Treatments attempted:hydrocortisone cream, benadryl, and lotion/moisturizer Shortness of breath: no  Throat/tongue swelling: no Myalgias/arthralgias: no   Relevant past medical, surgical, family and social history reviewed and updated as indicated. Interim medical history since our last visit reviewed. Allergies and medications reviewed and updated.  Review of Systems  Constitutional:  Negative for activity change, appetite change, diaphoresis, fatigue and fever.  Respiratory:  Negative for cough, chest tightness and shortness of breath.   Cardiovascular:  Negative for chest pain, palpitations and leg swelling.  Gastrointestinal:  Negative for abdominal distention, abdominal pain, constipation, diarrhea, nausea and vomiting.  Genitourinary:  Positive for vaginal discharge. Negative for decreased urine volume, dysuria, frequency, hematuria, urgency and vaginal pain.  Skin:  Positive for rash.  Neurological: Negative.   Psychiatric/Behavioral: Negative.      Per HPI unless specifically indicated above     Objective:     There were no vitals taken for this visit.  Wt Readings from Last 3 Encounters:  09/23/23 189  lb 3.2 oz (85.8 kg)  07/25/23 186 lb 3.2 oz (84.5 kg)  01/23/23 185 lb (83.9 kg)    Physical Exam Vitals and nursing note reviewed.  Constitutional:      General: She is awake. She is not  in acute distress.    Appearance: She is well-developed and well-groomed. She is not ill-appearing or toxic-appearing.  HENT:     Head: Normocephalic.     Right Ear: Hearing normal.     Left Ear: Hearing normal.  Eyes:     General: Lids are normal.        Right eye: No discharge.        Left eye: No discharge.     Conjunctiva/sclera: Conjunctivae normal.  Pulmonary:     Effort: Pulmonary effort is normal. No accessory muscle usage or respiratory distress.  Musculoskeletal:     Cervical back: Normal range of motion.  Skin:    Findings: Rash present.     Comments: Multiple red singular bumps to bilateral forearms noted via video exam.  No blisters present.  Neurological:     Mental Status: She is alert and oriented to person, place, and time.  Psychiatric:        Attention and Perception: Attention normal.        Mood and Affect: Mood normal.        Behavior: Behavior normal. Behavior is cooperative.        Thought Content: Thought content normal.        Judgment: Judgment normal.     Results for orders placed or performed in visit on 09/23/23  CBC with Differential/Platelet   Collection Time: 09/23/23 10:02 AM  Result Value Ref Range   WBC 5.5 3.4 - 10.8 x10E3/uL   RBC 5.32 (H) 3.77 - 5.28 x10E6/uL   Hemoglobin 14.8 11.1 - 15.9 g/dL   Hematocrit 16.1 09.6 - 46.6 %   MCV 86 79 - 97 fL   MCH 27.8 26.6 - 33.0 pg   MCHC 32.5 31.5 - 35.7 g/dL   RDW 04.5 40.9 - 81.1 %   Platelets 265 150 - 450 x10E3/uL   Neutrophils 54 Not Estab. %   Lymphs 29 Not Estab. %   Monocytes 11 Not Estab. %   Eos 5 Not Estab. %   Basos 1 Not Estab. %   Neutrophils Absolute 3.0 1.4 - 7.0 x10E3/uL   Lymphocytes Absolute 1.6 0.7 - 3.1 x10E3/uL   Monocytes Absolute 0.6 0.1 - 0.9 x10E3/uL   EOS (ABSOLUTE) 0.3 0.0 - 0.4 x10E3/uL   Basophils Absolute 0.0 0.0 - 0.2 x10E3/uL   Immature Granulocytes 0 Not Estab. %   Immature Grans (Abs) 0.0 0.0 - 0.1 x10E3/uL  Comprehensive metabolic panel   Collection  Time: 09/23/23 10:02 AM  Result Value Ref Range   Glucose 76 70 - 99 mg/dL   BUN 11 6 - 24 mg/dL   Creatinine, Ser 9.14 0.57 - 1.00 mg/dL   eGFR 81 >78 GN/FAO/1.30   BUN/Creatinine Ratio 13 9 - 23   Sodium 142 134 - 144 mmol/L   Potassium 4.4 3.5 - 5.2 mmol/L   Chloride 102 96 - 106 mmol/L   CO2 26 20 - 29 mmol/L   Calcium 9.7 8.7 - 10.2 mg/dL   Total Protein 7.1 6.0 - 8.5 g/dL   Albumin 4.1 3.8 - 4.9 g/dL   Globulin, Total 3.0 1.5 - 4.5 g/dL   Bilirubin Total 0.3 0.0 - 1.2 mg/dL   Alkaline Phosphatase 106 44 -  121 IU/L   AST 20 0 - 40 IU/L   ALT 36 (H) 0 - 32 IU/L  Lipid Panel w/o Chol/HDL Ratio   Collection Time: 09/23/23 10:02 AM  Result Value Ref Range   Cholesterol, Total 198 100 - 199 mg/dL   Triglycerides 60 0 - 149 mg/dL   HDL 61 >21 mg/dL   VLDL Cholesterol Cal 11 5 - 40 mg/dL   LDL Chol Calc (NIH) 308 (H) 0 - 99 mg/dL  TSH   Collection Time: 09/23/23 10:02 AM  Result Value Ref Range   TSH 0.793 0.450 - 4.500 uIU/mL  Microscopic Examination   Collection Time: 09/23/23  2:00 PM   Urine  Result Value Ref Range   WBC, UA 6-10 (A) 0 - 5 /hpf   RBC, Urine 0-2 0 - 2 /hpf   Epithelial Cells (non renal) 0-10 0 - 10 /hpf   Bacteria, UA Many (A) None seen/Few  Urinalysis, Routine w reflex microscopic   Collection Time: 09/23/23  2:00 PM  Result Value Ref Range   Specific Gravity, UA 1.020 1.005 - 1.030   pH, UA 6.0 5.0 - 7.5   Color, UA Yellow Yellow   Appearance Ur Cloudy (A) Clear   Leukocytes,UA 3+ (A) Negative   Protein,UA Negative Negative/Trace   Glucose, UA Negative Negative   Ketones, UA Negative Negative   RBC, UA Negative Negative   Bilirubin, UA Negative Negative   Urobilinogen, Ur 0.2 0.2 - 1.0 mg/dL   Nitrite, UA Positive (A) Negative   Microscopic Examination See below:   Urine Culture   Collection Time: 09/23/23  3:10 PM   Specimen: Urine   UR  Result Value Ref Range   Urine Culture, Routine Final report (A)    Organism ID, Bacteria  Escherichia coli (A)    Antimicrobial Susceptibility Comment       Assessment & Plan:   Problem List Items Addressed This Visit       Musculoskeletal and Integument   Rash - Primary   To both forearms after sun exposure.  Has tried OTC and prescription medications/creams without benefit.  Recommend she follow-up with dermatology as soon as possible for further testing.  Does take Vyvanse which on review can cause sun sensitivity.  Recommend loose/light clothing for coverage when outside.  Continue to use sunscreen.  Will send in steroid taper to see if beneficial to current rash.  Discussed with patient.        Genitourinary   Bacterial vaginosis   Acute, similar to recent infection.  [place] the beach.  Will start Flagyl  BID for 7 days, she has taken before with benefit and is aware not to drink alcohol while taking.      Relevant Medications   metroNIDAZOLE  (FLAGYL ) 500 MG tablet     Follow up plan: Return if symptoms worsen or fail to improve.

## 2024-01-09 NOTE — Assessment & Plan Note (Signed)
 To both forearms after sun exposure.  Has tried OTC and prescription medications/creams without benefit.  Recommend she follow-up with dermatology as soon as possible for further testing.  Does take Vyvanse which on review can cause sun sensitivity.  Recommend loose/light clothing for coverage when outside.  Continue to use sunscreen.  Will send in steroid taper to see if beneficial to current rash.  Discussed with patient.

## 2024-01-13 ENCOUNTER — Other Ambulatory Visit: Payer: Self-pay | Admitting: Nurse Practitioner

## 2024-01-13 DIAGNOSIS — Z1231 Encounter for screening mammogram for malignant neoplasm of breast: Secondary | ICD-10-CM

## 2024-01-15 DIAGNOSIS — J301 Allergic rhinitis due to pollen: Secondary | ICD-10-CM | POA: Diagnosis not present

## 2024-01-19 ENCOUNTER — Encounter

## 2024-01-19 DIAGNOSIS — F411 Generalized anxiety disorder: Secondary | ICD-10-CM | POA: Diagnosis not present

## 2024-01-19 DIAGNOSIS — Z1231 Encounter for screening mammogram for malignant neoplasm of breast: Secondary | ICD-10-CM

## 2024-01-26 ENCOUNTER — Ambulatory Visit

## 2024-02-03 DIAGNOSIS — F411 Generalized anxiety disorder: Secondary | ICD-10-CM | POA: Diagnosis not present

## 2024-02-12 DIAGNOSIS — J301 Allergic rhinitis due to pollen: Secondary | ICD-10-CM | POA: Diagnosis not present

## 2024-02-25 DIAGNOSIS — H15001 Unspecified scleritis, right eye: Secondary | ICD-10-CM | POA: Diagnosis not present

## 2024-03-19 NOTE — Patient Instructions (Signed)
 Be Involved in Caring For Your Health:  Taking Medications When medications are taken as directed, they can greatly improve your health. But if they are not taken as prescribed, they may not work. In some cases, not taking them correctly can be harmful. To help ensure your treatment remains effective and safe, understand your medications and how to take them. Bring your medications to each visit for review by your provider.  Your lab results, notes, and after visit summary will be available on My Chart. We strongly encourage you to use this feature. If lab results are abnormal the clinic will contact you with the appropriate steps. If the clinic does not contact you assume the results are satisfactory. You can always view your results on My Chart. If you have questions regarding your health or results, please contact the clinic during office hours. You can also ask questions on My Chart.  We at [facility] are grateful that you chose [place] to provide your care. We strive to provide evidence-based and compassionate care and are always looking for feedback. If you get a survey from the clinic please complete this so we can hear your opinions.  Managing Depression, Adult Depression is a mental health condition that affects your thoughts, feelings, and actions. Being diagnosed with depression can bring you relief if you did not know why you have felt or behaved a certain way. It could also leave you feeling overwhelmed. Finding ways to manage your symptoms can help you feel more positive about your future. How to manage lifestyle changes Being depressed is difficult. Depression can increase the level of everyday stress. Stress can make depression symptoms worse. You may believe your symptoms cannot be managed or will never improve. However, there are many things you can try to help manage your symptoms. There is hope. Managing stress  Stress is your body's reaction to life changes and events,  both good and bad. Stress can add to your feelings of depression. Learning to manage your stress can help lessen your feelings of depression. Try some of the following approaches to reducing your stress (stress reduction techniques): Listen to music that you enjoy and that inspires you. Try using a meditation app or take a meditation class. Develop a practice that helps you connect with your spiritual self. Walk in nature, pray, or go to a place of worship. Practice deep breathing. To do this, inhale slowly through your nose. Pause at the top of your inhale for a few seconds and then exhale slowly, letting yourself relax. Repeat this three or four times. Practice yoga to help relax and work your muscles. Choose a stress reduction technique that works for you. These techniques take time and practice to develop. Set aside 5-15 minutes a day to do them. Therapists can offer training in these techniques. Do these things to help manage stress: Keep a journal. Know your limits. Set healthy boundaries for yourself and others, such as saying "no" when you think something is too much. Pay attention to how you react to certain situations. You may not be able to control everything, but you can change your reaction. Add humor to your life by watching funny movies or shows. Make time for activities that you enjoy and that relax you. Spend less time using electronics, especially at night before bed. The light from screens can make your brain think it is time to get up rather than go to bed.  Medicines Medicines, such as antidepressants, are often a part of  treatment for depression. Talk with your pharmacist or health care provider about all the medicines, supplements, and herbal products that you take, their possible side effects, and what medicines and other products are safe to take together. Make sure to report any side effects you may have to your health care provider. Relationships Your health care  provider may suggest family therapy, couples therapy, or individual therapy as part of your treatment. How to recognize changes Everyone responds differently to treatment for depression. As you recover from depression, you may start to: Have more interest in doing activities. Feel more hopeful. Have more energy. Eat a more regular amount of food. Have better mental focus. It is important to recognize if your depression is not getting better or is getting worse. The symptoms you had in the beginning may return, such as: Feeling tired. Eating too much or too little. Sleeping too much or too little. Feeling restless, agitated, or hopeless. Trouble focusing or making decisions. Having unexplained aches and pains. Feeling irritable, angry, or aggressive. If you or your family members notice these symptoms coming back, let your health care provider know right away. Follow these instructions at home: Activity Try to get some form of exercise each day, such as walking. Try yoga, mindfulness, or other stress reduction techniques. Participate in group activities if you are able. Lifestyle Get enough sleep. Cut down on or stop using caffeine, tobacco, alcohol, and any other harmful substances. Eat a healthy diet that includes plenty of vegetables, fruits, whole grains, low-fat dairy products, and lean protein. Limit foods that are high in solid fats, added sugar, or salt (sodium). General instructions Take over-the-counter and prescription medicines only as told by your health care provider. Keep all follow-up visits. It is important for your health care provider to check on your mood, behavior, and medicines. Your health care provider may need to make changes to your treatment. Where to find support Talking to others  Friends and family members can be sources of support and guidance. Talk to trusted friends or family members about your condition. Explain your symptoms and let them know that you  are working with a health care provider to treat your depression. Tell friends and family how they can help. Finances Find mental health providers that fit with your financial situation. Talk with your health care provider if you are worried about access to food, housing, or medicine. Call your insurance company to learn about your co-pays and prescription plan. Where to find more information You can find support in your area from: Anxiety and Depression Association of America (ADAA): adaa.org Mental Health [place]: mentalhealthamerica.net [employer] on Mental Illness: nami.org Contact a health care provider if: You stop taking your antidepressant medicines, and you have any of these symptoms: Nausea. Headache. Light-headedness. Chills and body aches. Not being able to sleep (insomnia). You or your friends and family think your depression is getting worse. Get help right away if: You have thoughts of hurting yourself or others. Get help right away if you feel like you may hurt yourself or others, or have thoughts about taking your own life. Go to your nearest emergency room or: Call 911. Call the National Suicide Prevention Lifeline at 315-804-2514 or 988. This is open 24 hours a day. Text the Crisis Text Line at 902-341-0291. This information is not intended to replace advice given to you by your health care provider. Make sure you discuss any questions you have with your health care provider. Document Revised: 12/11/2021 Document Reviewed:  12/11/2021 Elsevier Patient Education  2024 [employer].

## 2024-03-22 ENCOUNTER — Encounter: Payer: Self-pay | Admitting: Nurse Practitioner

## 2024-03-22 ENCOUNTER — Ambulatory Visit (INDEPENDENT_AMBULATORY_CARE_PROVIDER_SITE_OTHER): Payer: BC Managed Care – PPO | Admitting: Nurse Practitioner

## 2024-03-22 VITALS — BP 117/76 | HR 97 | Temp 98.0°F | Ht 69.8 in | Wt 182.8 lb

## 2024-03-22 DIAGNOSIS — N951 Menopausal and female climacteric states: Secondary | ICD-10-CM | POA: Insufficient documentation

## 2024-03-22 DIAGNOSIS — F418 Other specified anxiety disorders: Secondary | ICD-10-CM

## 2024-03-22 DIAGNOSIS — N959 Unspecified menopausal and perimenopausal disorder: Secondary | ICD-10-CM | POA: Insufficient documentation

## 2024-03-22 DIAGNOSIS — F5104 Psychophysiologic insomnia: Secondary | ICD-10-CM

## 2024-03-22 DIAGNOSIS — E7800 Pure hypercholesterolemia, unspecified: Secondary | ICD-10-CM

## 2024-03-22 DIAGNOSIS — R41840 Attention and concentration deficit: Secondary | ICD-10-CM

## 2024-03-22 MED ORDER — GABAPENTIN 100 MG PO CAPS: 100.0000 mg | ORAL_CAPSULE | Freq: Every day | ORAL | 3 refills | Status: DC

## 2024-03-22 NOTE — Assessment & Plan Note (Signed)
 Ongoing with current stressors. May continue Melatonin only as needed.  Continue to collaborate with psychiatry. Refer to hot flashes plan of care for further.

## 2024-03-22 NOTE — Assessment & Plan Note (Signed)
 Chronic, stable at present.  She has been followed by Dr. Vincente in [place] for >5 years, recommend she maintain this relationship and visits with them.  Is currently on Valium  as needed.  Would be beneficial to maintain psychiatry collaboration due to higher risk medication regimen.

## 2024-03-22 NOTE — Assessment & Plan Note (Signed)
 Chronic, ongoing.  She has been followed by Dr. Evelene Croon in [place] for 5 years, recommend she maintain this relationship and visits with them.  Is currently on Valium as needed.  Would be beneficial to maintain psychiatry collaboration due to higher risk medication regimen.

## 2024-03-22 NOTE — Assessment & Plan Note (Signed)
 Ongoing, we discussed all treatment options, including no treatment.  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  At this time will trial Gabapentin  100 MG at night and increased in one week to 200 MG at night if tolerating.  This may help sleep and night sweats + hot flashes.  No cycle in 6 months, we discussed once she reaches one year without cycle she is considered menopausal.

## 2024-03-22 NOTE — Progress Notes (Signed)
 BP 117/76   Pulse 97   Temp 98 F (36.7 C) (Oral)   Ht 5' 9.8 (1.773 m)   Wt 182 lb 12.8 oz (82.9 kg)   SpO2 95%   BMI 26.38 kg/m    Subjective:    Patient ID: Brittney Alvarez, female    DOB: June 12, 1971, 53 y.o.   MRN: [id number]  HPI: Brittney Alvarez is a 53 y.o. female  Chief Complaint  Patient presents with   Depression   Anxiety   MENOPAUSAL SYMPTOMS Continues to have issues with hot flashes. Has not had a menstrual cycle in 6 months. Gravida/Para: 4/2 Duration: uncontrolled Symptom severity: moderate Hot flashes: yes Night sweats: yes Sleep disturbances: yes Vaginal dryness: yes Dyspareunia:no Decreased libido: no Emotional lability: no Stress incontinence: occasional Previous HRT/pharmacotherapy: no Hysterectomy: yes GYN surgery:  Absolute Contraindications to Hormonal Therapy:     Undiagnosed vaginal bleeding: no    Breast cancer: no    Endometrial cancer: possibly her maternal grandmother    Coronary disease: no    Cerebrovascular disease: no    Venous thromboembolic disease: no   ANXIETY & ADHD Follows with Dr. Vincente in [place], psychiatry.  Has been with her for 5 years, but not seen in one year. Taking Viibryd and Vyvanse.  Uses Valium  for anxiety as needed, ordered by psychiatry. Has not had to take recently.  In past she took Lexapro for mood, but this reduced sexual drive.   Works for [employer] in [place], currently this is stable.  Is working relationship out with her husband. Duration:stable Anxious mood: improving Excessive worrying: improving Irritability:improving Sweating: no Nausea: no Palpitations:no Hyperventilation: no Panic attacks: no Agoraphobia: no  Obscessions/compulsions: no Depressed mood: no    03-31-2024    9:55 AM 09/23/2023    9:36 AM 07/25/2023    8:44 AM 01/23/2023    8:20 AM  Depression screen PHQ 2/9  Decreased Interest 0 1 0 1  Down, Depressed, Hopeless 0 1 1 1   PHQ - 2 Score 0 2 1 2   Altered sleeping 1 1 3 1    Tired, decreased energy 0 1 1 1   Change in appetite 0 2 1 1   Feeling bad or failure about yourself  1 2 0 0  Trouble concentrating 0 1 2 1   Moving slowly or fidgety/restless 0 0 1 0  Suicidal thoughts 0 0 0 0  PHQ-9 Score 2 9 9 6   Difficult doing work/chores Not difficult at all Very difficult Not difficult at all Somewhat difficult   Anhedonia: no Weight changes: no Insomnia: yes hard to stay asleep due to night sweats Hypersomnia: no Fatigue/loss of energy: no Feelings of worthlessness: no Feelings of guilt: no Impaired concentration/indecisiveness: yes Suicidal ideations: no  Crying spells: no Recent Stressors/Life Changes: yes   Relationship problems: no   Family stress: no     Financial stress: no    Job stress: no    Recent death/loss: no     31-Mar-2024    9:56 AM 09/23/2023    9:36 AM 07/25/2023    8:44 AM 01/23/2023    8:20 AM  GAD 7 : Generalized Anxiety Score  Nervous, Anxious, on Edge 1 3 1 1   Control/stop worrying 1 2 0 1  Worry too much - different things 1 2 1 1   Trouble relaxing 0 3 1 0  Restless 1 2 1  0  Easily annoyed or irritable 0 2 1 2   Afraid - awful might happen 1 1 0  0  Total GAD 7 Score 5 15 5 5   Anxiety Difficulty Not difficult at all Very difficult Not difficult at all Somewhat difficult   Relevant past medical, surgical, family and social history reviewed and updated as indicated. Interim medical history since our last visit reviewed. Allergies and medications reviewed and updated.  Review of Systems  Constitutional:  Negative for activity change, appetite change, diaphoresis, fatigue and fever.  Respiratory:  Negative for cough, chest tightness, shortness of breath and wheezing.   Cardiovascular:  Negative for chest pain, palpitations and leg swelling.  Gastrointestinal: Negative.   Neurological: Negative.   Psychiatric/Behavioral:  Positive for decreased concentration and sleep disturbance. Negative for suicidal ideas. The patient is not  nervous/anxious.     Per HPI unless specifically indicated above     Objective:    BP 117/76   Pulse 97   Temp 98 F (36.7 C) (Oral)   Ht 5' 9.8 (1.773 m)   Wt 182 lb 12.8 oz (82.9 kg)   SpO2 95%   BMI 26.38 kg/m   Wt Readings from Last 3 Encounters:  03/22/24 182 lb 12.8 oz (82.9 kg)  09/23/23 189 lb 3.2 oz (85.8 kg)  07/25/23 186 lb 3.2 oz (84.5 kg)    Physical Exam Vitals and nursing note reviewed.  Constitutional:      General: She is awake. She is not in acute distress.    Appearance: She is well-developed and well-groomed. She is not ill-appearing or toxic-appearing.  HENT:     Head: Normocephalic.     Right Ear: Hearing and external ear normal.     Left Ear: Hearing and external ear normal.  Eyes:     General: Lids are normal.        Right eye: No discharge.        Left eye: No discharge.     Conjunctiva/sclera: Conjunctivae normal.     Pupils: Pupils are equal, round, and reactive to light.  Neck:     Thyroid : No thyromegaly.     Vascular: No carotid bruit.  Cardiovascular:     Rate and Rhythm: Normal rate and regular rhythm.     Heart sounds: Normal heart sounds. No murmur heard.    No gallop.  Pulmonary:     Effort: Pulmonary effort is normal. No accessory muscle usage or respiratory distress.     Breath sounds: Normal breath sounds.  Abdominal:     General: Bowel sounds are normal. There is no distension.     Palpations: Abdomen is soft.     Tenderness: There is no abdominal tenderness.  Musculoskeletal:     Cervical back: Normal range of motion and neck supple.     Right lower leg: No edema.     Left lower leg: No edema.  Lymphadenopathy:     Cervical: No cervical adenopathy.  Skin:    General: Skin is warm and dry.  Neurological:     Mental Status: She is alert and oriented to person, place, and time.     Deep Tendon Reflexes: Reflexes are normal and symmetric.     Reflex Scores:      Brachioradialis reflexes are 2+ on the right side and 2+  on the left side.      Patellar reflexes are 2+ on the right side and 2+ on the left side. Psychiatric:        Attention and Perception: Attention normal.        Mood and Affect: Mood normal.  Speech: Speech normal.        Behavior: Behavior normal. Behavior is cooperative.        Thought Content: Thought content normal.    Results for orders placed or performed in visit on 09/23/23  CBC with Differential/Platelet   Collection Time: 09/23/23 10:02 AM  Result Value Ref Range   WBC 5.5 3.4 - 10.8 x10E3/uL   RBC 5.32 (H) 3.77 - 5.28 x10E6/uL   Hemoglobin 14.8 11.1 - 15.9 g/dL   Hematocrit 54.4 65.9 - 46.6 %   MCV 86 79 - 97 fL   MCH 27.8 26.6 - 33.0 pg   MCHC 32.5 31.5 - 35.7 g/dL   RDW 87.2 88.2 - 84.5 %   Platelets 265 150 - 450 x10E3/uL   Neutrophils 54 Not Estab. %   Lymphs 29 Not Estab. %   Monocytes 11 Not Estab. %   Eos 5 Not Estab. %   Basos 1 Not Estab. %   Neutrophils Absolute 3.0 1.4 - 7.0 x10E3/uL   Lymphocytes Absolute 1.6 0.7 - 3.1 x10E3/uL   Monocytes Absolute 0.6 0.1 - 0.9 x10E3/uL   EOS (ABSOLUTE) 0.3 0.0 - 0.4 x10E3/uL   Basophils Absolute 0.0 0.0 - 0.2 x10E3/uL   Immature Granulocytes 0 Not Estab. %   Immature Grans (Abs) 0.0 0.0 - 0.1 x10E3/uL  Comprehensive metabolic panel   Collection Time: 09/23/23 10:02 AM  Result Value Ref Range   Glucose 76 70 - 99 mg/dL   BUN 11 6 - 24 mg/dL   Creatinine, Ser 9.12 0.57 - 1.00 mg/dL   eGFR 81 >40 fO/fpw/8.26   BUN/Creatinine Ratio 13 9 - 23   Sodium 142 134 - 144 mmol/L   Potassium 4.4 3.5 - 5.2 mmol/L   Chloride 102 96 - 106 mmol/L   CO2 26 20 - 29 mmol/L   Calcium 9.7 8.7 - 10.2 mg/dL   Total Protein 7.1 6.0 - 8.5 g/dL   Albumin 4.1 3.8 - 4.9 g/dL   Globulin, Total 3.0 1.5 - 4.5 g/dL   Bilirubin Total 0.3 0.0 - 1.2 mg/dL   Alkaline Phosphatase 106 44 - 121 IU/L   AST 20 0 - 40 IU/L   ALT 36 (H) 0 - 32 IU/L  Lipid Panel w/o Chol/HDL Ratio   Collection Time: 09/23/23 10:02 AM  Result Value Ref  Range   Cholesterol, Total 198 100 - 199 mg/dL   Triglycerides 60 0 - 149 mg/dL   HDL 61 >60 mg/dL   VLDL Cholesterol Cal 11 5 - 40 mg/dL   LDL Chol Calc (NIH) 873 (H) 0 - 99 mg/dL  TSH   Collection Time: 09/23/23 10:02 AM  Result Value Ref Range   TSH 0.793 0.450 - 4.500 uIU/mL  Microscopic Examination   Collection Time: 09/23/23  2:00 PM   Urine  Result Value Ref Range   WBC, UA 6-10 (A) 0 - 5 /hpf   RBC, Urine 0-2 0 - 2 /hpf   Epithelial Cells (non renal) 0-10 0 - 10 /hpf   Bacteria, UA Many (A) None seen/Few  Urinalysis, Routine w reflex microscopic   Collection Time: 09/23/23  2:00 PM  Result Value Ref Range   Specific Gravity, UA 1.020 1.005 - 1.030   pH, UA 6.0 5.0 - 7.5   Color, UA Yellow Yellow   Appearance Ur Cloudy (A) Clear   Leukocytes,UA 3+ (A) Negative   Protein,UA Negative Negative/Trace   Glucose, UA Negative Negative   Ketones, UA Negative Negative  RBC, UA Negative Negative   Bilirubin, UA Negative Negative   Urobilinogen, Ur 0.2 0.2 - 1.0 mg/dL   Nitrite, UA Positive (A) Negative   Microscopic Examination See below:   Urine Culture   Collection Time: 09/23/23  3:10 PM   Specimen: Urine   UR  Result Value Ref Range   Urine Culture, Routine Final report (A)    Organism ID, Bacteria Escherichia coli (A)    Antimicrobial Susceptibility Comment       Assessment & Plan:   Problem List Items Addressed This Visit       Cardiovascular and Mediastinum   Menopausal hot flushes   Ongoing, we discussed all treatment options, including no treatment.  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  At this time will trial Gabapentin  100 MG at night and increased in one week to 200 MG at night if tolerating.  This may help sleep and night sweats + hot flashes.  No cycle in 6 months, we discussed once she reaches one year without cycle she is  considered menopausal.        Other   Situational anxiety - Primary   Chronic, stable at present.  She has been followed by Dr. Vincente in [place] for >5 years, recommend she maintain this relationship and visits with them.  Is currently on Valium  as needed.  Would be beneficial to maintain psychiatry collaboration due to higher risk medication regimen.      Relevant Medications   Vilazodone HCl (VIIBRYD) 10 MG TABS   Insomnia   Ongoing with current stressors. May continue Melatonin only as needed.  Continue to collaborate with psychiatry. Refer to hot flashes plan of care for further.      ADD (attention deficit disorder)   Chronic, ongoing.  She has been followed by Dr. Vincente in [place] for >5 years, recommend she maintain this relationship and visits with them.  Is currently on Valium  as needed.  Would be beneficial to maintain psychiatry collaboration due to higher risk medication regimen.        Follow up plan: Return in about 4 weeks (around 04/19/2024) for Virtual in 4 weeks for menopause.

## 2024-04-18 NOTE — Patient Instructions (Signed)
 Menopause: What to Know Menopause is the time in your life when your menstrual periods stop. It marks the end of your ability to get pregnant. It can be defined as not having a period for 12 months without another medical cause. The time when you start to move into menopause is called perimenopause. It often happens between ages 67-55. It can last for many years. During perimenopause, hormone levels change in your body. This can cause symptoms and affect your health. Menopause may make you more likely to have: Bones that are weak and break more easily. Depression. This is when you feel sad or hopeless. Arteries that harden and get narrow. These can cause heart attacks and strokes. What are the causes? In most cases, menopause is a natural change to your body and hormone levels that happens as you get older. But in some cases, it may be caused by changes that aren't natural. These include: Surgery to take out both ovaries. Side effects from some medicines. What increases the risk? You're more likely to go through menopause early if: You have an abnormal growth (tumor) of the pituitary gland in your brain. You have a disease that affects your ovaries. You've had certain treatments for cancer. These include: Chemotherapy. Hormone therapy. Radiation therapy on the area between your hips (pelvis). You smoke a lot or drink a lot of alcohol. Other people in your family have gone through menopause early. You're very thin. What are the signs or symptoms? You may have: Hot flashes. Irregular periods. Night sweats. Changes in how you feel about sex. You may: Have less of a sex drive. Feel more discomfort around your sexuality. Vaginal dryness and thinning of the vaginal walls. This may make it hurt to have sex. Skin changes, such as: Dry skin. New wrinkles. Headaches. Other symptoms may include: Trouble sleeping. Mood swings. Memory problems. Weight gain. Hair growth on your face and  chest. Bladder infections or trouble peeing. How is this diagnosed? You may be diagnosed based on: Your medical history. An exam. Your age. Your history of menstrual periods. Your symptoms. Hormone tests. How is this treated? In some cases, no treatment is needed. Talk with your health care provider about if you should get treated. Treatments may include: Menopausal hormone therapy (MHT). Medicines to treat certain symptoms. Acupuncture. Vitamin or herbal supplements. Before you start treatment, let your provider know if you or anyone in your family has or has had: Heart disease. Breast cancer. Blood clots. Diabetes. Osteoporosis. Follow these instructions at home: Eating and drinking  Eat a balanced diet. It should include: Fresh fruits and vegetables. Whole grains. Lean protein. Low-fat dairy. Eat lots of foods that have calcium and vitamin D in them. These can help keep your bones healthy. Foods and drinks that are rich in calcium include: Yogurt and low-fat milk. Beans. Almonds. Sardines. Broccoli and kale. To help prevent hot flashes, stay away from: Alcohol. Drinks with caffeine in them. Spicy foods. Lifestyle Do not smoke, vape, or use nicotine or tobacco. Get 7-8 hours of sleep each night. If you have hot flashes, you may want to: Dress in layers. Avoid things that may trigger hot flashes, like warm places or stress. Take slow, deep breaths when a hot flash starts. Keep a fan in your home and office. Find ways to manage stress. You may want to try: Deep breathing. Meditation. Writing in a journal. Ask your provider about going to group therapy. Therapy can help you get support from others who are going  through menopause. General instructions  Talk with your provider before you take any herbal supplements. Keep track of your symptoms. Track: When they start. How often you have them. How long they last. Use vaginal lubricants or moisturizers. These  can help with: Vaginal dryness. Comfort during sex. Contact a health care provider if: You're older than 55 and still get periods. You have pain during sex. You haven't had a period for 12 months and then start to bleed from your vagina. It hurts to pee. You get very bad headaches. Get help right away if: You're very depressed. You have a lot of bleeding from your vagina. Your heart is beating too fast. You have very bad belly pain or indigestion that doesn't go away with medicines. This information is not intended to replace advice given to you by your health care provider. Make sure you discuss any questions you have with your health care provider. Document Revised: 04/10/2023 Document Reviewed: 04/10/2023 Elsevier Patient Education  2024 [employer].

## 2024-04-22 DIAGNOSIS — F3342 Major depressive disorder, recurrent, in full remission: Secondary | ICD-10-CM | POA: Diagnosis not present

## 2024-04-22 DIAGNOSIS — F411 Generalized anxiety disorder: Secondary | ICD-10-CM | POA: Diagnosis not present

## 2024-04-22 DIAGNOSIS — F900 Attention-deficit hyperactivity disorder, predominantly inattentive type: Secondary | ICD-10-CM | POA: Diagnosis not present

## 2024-04-23 ENCOUNTER — Telehealth (INDEPENDENT_AMBULATORY_CARE_PROVIDER_SITE_OTHER): Admitting: Nurse Practitioner

## 2024-04-23 ENCOUNTER — Encounter: Payer: Self-pay | Admitting: Nurse Practitioner

## 2024-04-23 DIAGNOSIS — N951 Menopausal and female climacteric states: Secondary | ICD-10-CM

## 2024-04-23 DIAGNOSIS — Z1231 Encounter for screening mammogram for malignant neoplasm of breast: Secondary | ICD-10-CM

## 2024-04-23 MED ORDER — GABAPENTIN 300 MG PO CAPS
300.0000 mg | ORAL_CAPSULE | Freq: Every day | ORAL | 4 refills | Status: AC
Start: 2024-04-23 — End: ?

## 2024-04-23 NOTE — Progress Notes (Signed)
 There were no vitals taken for this visit.   Subjective:    Patient ID: Brittney Alvarez, female    DOB: [birth year], 53 y.o.   MRN: [id number]  HPI: Brittney Alvarez is a 53 y.o. female  Chief Complaint  Patient presents with   Menopause    Does not feel the gabapentin  has helped any. Doesn't feel things are worse.    Virtual Visit via Video Note  I connected with Brittney Alvarez on 04/23/24 at  3:20 PM EDT by a video enabled telemedicine application and verified that I am speaking with the correct person using two identifiers.  Location: Patient: work Restaurant manager, fast food: work   I discussed the limitations of evaluation and management by telemedicine and the availability of in person appointments. The patient expressed understanding and agreed to proceed.  I discussed the assessment and treatment plan with the patient. The patient was provided an opportunity to ask questions and all were answered. The patient agreed with the plan and demonstrated an understanding of the instructions.   The patient was advised to call back or seek an in-person evaluation if the symptoms worsen or if the condition fails to improve as anticipated.  I provided 25 minutes of non-face-to-face time during this encounter.   Jason Frisbee T Maegen Wigle, NP   MENOPAUSAL SYMPTOMS Follow-up today after starting Gabapentin  on 03/22/24. Continues to have issues with hot flashes, Gabapentin  not helping. Has not had a menstrual cycle in 7 months. Gravida/Para: 4/2 Duration: uncontrolled Symptom severity: moderate Hot flashes: yes Night sweats: yes Sleep disturbances: yes Vaginal dryness: yes Dyspareunia:no Decreased libido: no Emotional lability: no Stress incontinence: occasional Previous HRT/pharmacotherapy: no Hysterectomy: no GYN surgery:  Absolute Contraindications to Hormonal Therapy:     Undiagnosed vaginal bleeding: no    Breast cancer: no    Endometrial cancer: possibly her maternal grandmother    Coronary disease:  no    Cerebrovascular disease: no    Venous thromboembolic disease: no   Relevant past medical, surgical, family and social history reviewed and updated as indicated. Interim medical history since our last visit reviewed. Allergies and medications reviewed and updated.  Review of Systems  Constitutional:  Negative for activity change, appetite change, diaphoresis, fatigue and fever.  Respiratory:  Negative for cough, chest tightness, shortness of breath and wheezing.   Cardiovascular:  Negative for chest pain, palpitations and leg swelling.  Gastrointestinal: Negative.   Neurological: Negative.   Psychiatric/Behavioral:  Positive for sleep disturbance. Negative for decreased concentration and suicidal ideas. The patient is not nervous/anxious.     Per HPI unless specifically indicated above     Objective:    There were no vitals taken for this visit.  Wt Readings from Last 3 Encounters:  03/22/24 182 lb 12.8 oz (82.9 kg)  09/23/23 189 lb 3.2 oz (85.8 kg)  07/25/23 186 lb 3.2 oz (84.5 kg)    Physical Exam Vitals and nursing note reviewed.  Constitutional:      General: She is awake. She is not in acute distress.    Appearance: She is well-developed. She is not ill-appearing.  HENT:     Head: Normocephalic.     Right Ear: Hearing normal.     Left Ear: Hearing normal.  Eyes:     General: Lids are normal.        Right eye: No discharge.        Left eye: No discharge.     Conjunctiva/sclera: Conjunctivae normal.  Pulmonary:  Effort: Pulmonary effort is normal. No accessory muscle usage or respiratory distress.  Musculoskeletal:     Cervical back: Normal range of motion.  Neurological:     Mental Status: She is alert and oriented to person, place, and time.  Psychiatric:        Attention and Perception: Attention normal.        Mood and Affect: Mood normal.        Behavior: Behavior normal. Behavior is cooperative.        Thought Content: Thought content normal.         Judgment: Judgment normal.     Results for orders placed or performed in visit on 09/23/23  CBC with Differential/Platelet   Collection Time: 09/23/23 10:02 AM  Result Value Ref Range   WBC 5.5 3.4 - 10.8 x10E3/uL   RBC 5.32 (H) 3.77 - 5.28 x10E6/uL   Hemoglobin 14.8 11.1 - 15.9 g/dL   Hematocrit 54.4 65.9 - 46.6 %   MCV 86 79 - 97 fL   MCH 27.8 26.6 - 33.0 pg   MCHC 32.5 31.5 - 35.7 g/dL   RDW 87.2 88.2 - 84.5 %   Platelets 265 150 - 450 x10E3/uL   Neutrophils 54 Not Estab. %   Lymphs 29 Not Estab. %   Monocytes 11 Not Estab. %   Eos 5 Not Estab. %   Basos 1 Not Estab. %   Neutrophils Absolute 3.0 1.4 - 7.0 x10E3/uL   Lymphocytes Absolute 1.6 0.7 - 3.1 x10E3/uL   Monocytes Absolute 0.6 0.1 - 0.9 x10E3/uL   EOS (ABSOLUTE) 0.3 0.0 - 0.4 x10E3/uL   Basophils Absolute 0.0 0.0 - 0.2 x10E3/uL   Immature Granulocytes 0 Not Estab. %   Immature Grans (Abs) 0.0 0.0 - 0.1 x10E3/uL  Comprehensive metabolic panel   Collection Time: 09/23/23 10:02 AM  Result Value Ref Range   Glucose 76 70 - 99 mg/dL   BUN 11 6 - 24 mg/dL   Creatinine, Ser 9.12 0.57 - 1.00 mg/dL   eGFR 81 >40 fO/fpw/8.26   BUN/Creatinine Ratio 13 9 - 23   Sodium 142 134 - 144 mmol/L   Potassium 4.4 3.5 - 5.2 mmol/L   Chloride 102 96 - 106 mmol/L   CO2 26 20 - 29 mmol/L   Calcium 9.7 8.7 - 10.2 mg/dL   Total Protein 7.1 6.0 - 8.5 g/dL   Albumin 4.1 3.8 - 4.9 g/dL   Globulin, Total 3.0 1.5 - 4.5 g/dL   Bilirubin Total 0.3 0.0 - 1.2 mg/dL   Alkaline Phosphatase 106 44 - 121 IU/L   AST 20 0 - 40 IU/L   ALT 36 (H) 0 - 32 IU/L  Lipid Panel w/o Chol/HDL Ratio   Collection Time: 09/23/23 10:02 AM  Result Value Ref Range   Cholesterol, Total 198 100 - 199 mg/dL   Triglycerides 60 0 - 149 mg/dL   HDL 61 >60 mg/dL   VLDL Cholesterol Cal 11 5 - 40 mg/dL   LDL Chol Calc (NIH) 873 (H) 0 - 99 mg/dL  TSH   Collection Time: 09/23/23 10:02 AM  Result Value Ref Range   TSH 0.793 0.450 - 4.500 uIU/mL  Microscopic  Examination   Collection Time: 09/23/23  2:00 PM   Urine  Result Value Ref Range   WBC, UA 6-10 (A) 0 - 5 /hpf   RBC, Urine 0-2 0 - 2 /hpf   Epithelial Cells (non renal) 0-10 0 - 10 /hpf   Bacteria, UA  Many (A) None seen/Few  Urinalysis, Routine w reflex microscopic   Collection Time: 09/23/23  2:00 PM  Result Value Ref Range   Specific Gravity, UA 1.020 1.005 - 1.030   pH, UA 6.0 5.0 - 7.5   Color, UA Yellow Yellow   Appearance Ur Cloudy (A) Clear   Leukocytes,UA 3+ (A) Negative   Protein,UA Negative Negative/Trace   Glucose, UA Negative Negative   Ketones, UA Negative Negative   RBC, UA Negative Negative   Bilirubin, UA Negative Negative   Urobilinogen, Ur 0.2 0.2 - 1.0 mg/dL   Nitrite, UA Positive (A) Negative   Microscopic Examination See below:   Urine Culture   Collection Time: 09/23/23  3:10 PM   Specimen: Urine   UR  Result Value Ref Range   Urine Culture, Routine Final report (A)    Organism ID, Bacteria Escherichia coli (A)    Antimicrobial Susceptibility Comment       Assessment & Plan:   Problem List Items Addressed This Visit       Cardiovascular and Mediastinum   Menopausal hot flushes - Primary   Ongoing, we discussed all treatment options, including no treatment.  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 At this time will increase Gabapentin  to 300 MG at night, which she prefers, and may increase further if no benefit.  This may help sleep and night sweats + hot flashes.  No cycle in 7 months, we discussed once she reaches one year without cycle she is considered menopausal.      Other Visit Diagnoses       Encounter for screening mammogram for malignant neoplasm of breast       Mammogram ordered and instructed how to obtain.        Follow up plan: Return in about 4 weeks (around 05/21/2024) for Menopause -- increase Gabapentin   to 300 MG at night.

## 2024-04-23 NOTE — Assessment & Plan Note (Addendum)
 Ongoing, we discussed all treatment options, including no treatment.  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 At this time will increase Gabapentin  to 300 MG at night, which she prefers, and may increase further if no benefit.  This may help sleep and night sweats + hot flashes.  No cycle in 7 months, we discussed once she reaches one year without cycle she is considered menopausal.

## 2024-04-26 NOTE — Progress Notes (Signed)
 Called patient, lvm to call and schedule 4 week virtual appt

## 2024-04-27 NOTE — Progress Notes (Signed)
 Called patient and left a message for her to call back to get scheduled for a 4 week follow up

## 2024-04-30 NOTE — Progress Notes (Signed)
 Called patient and left a message for her to call back to get scheduled for a 4 week follow up virtually.

## 2024-05-07 DIAGNOSIS — B0052 Herpesviral keratitis: Secondary | ICD-10-CM | POA: Diagnosis not present

## 2024-05-10 DIAGNOSIS — B0052 Herpesviral keratitis: Secondary | ICD-10-CM | POA: Diagnosis not present

## 2024-05-31 ENCOUNTER — Encounter: Payer: Self-pay | Admitting: Nurse Practitioner

## 2024-06-01 ENCOUNTER — Telehealth: Admitting: Nurse Practitioner

## 2024-06-01 ENCOUNTER — Encounter: Payer: Self-pay | Admitting: Nurse Practitioner

## 2024-06-01 DIAGNOSIS — U071 COVID-19: Secondary | ICD-10-CM | POA: Diagnosis not present

## 2024-06-01 MED ORDER — NIRMATRELVIR/RITONAVIR (PAXLOVID)TABLET: 3.0000 | ORAL_TABLET | Freq: Two times a day (BID) | ORAL | 0 refills | Status: AC

## 2024-06-01 MED ORDER — HYDROCOD POLI-CHLORPHE POLI ER 10-8 MG/5ML PO SUER: 5.0000 mL | Freq: Every evening | ORAL | 0 refills | Status: DC | PRN

## 2024-06-01 NOTE — Assessment & Plan Note (Signed)
 Acute with symptoms for 3 days, tested positive at home yesterday/negative flu. Would like to start Paxlovid.  Educated her on this treatment and side effects. Sent script in. Last eGFR >60.  Will send in Tussionex to use at night for cough.  Recommend: - Increased rest - Increasing Fluids - Acetaminophen  / ibuprofen  as needed for fever/pain.  - Salt water gargling, chloraseptic spray and throat lozenges - Mucinex.  - Humidifying the air.  - Isolate from family and avoid going into work until symptoms improve

## 2024-06-01 NOTE — Patient Instructions (Signed)
 COVID-19: What to Know COVID-19 is an infection caused by a virus called SARS-CoV-2. This type of virus is called a coronavirus. People with COVID-19 may: Have few to no symptoms. Have mild to moderate symptoms that affect their lungs and breathing. Get very sick. What are the causes?  COVID-19 is caused by a virus. This virus may be in the air as droplets or on surfaces. It can spread from an infected person when they cough, sneeze, speak, sing, or breathe. You may become infected if: You breathe in the infected droplets in the air. You touch an object that has the virus on it. What increases the risk? You are at risk of getting COVID-19 if you have been around someone with the infection. You may be more likely to get very sick if: You are 51 years old or older. You have certain medical conditions, such as: Heart disease. Diabetes. Long-term respiratory disease. Cancer. Pregnancy. You are immunocompromised. This means your body can't fight infections easily. You have a disability that makes it hard for you to move around, you have trouble moving, or you can't move at all. What are the signs or symptoms? People may have different symptoms from COVID-19. The symptoms can also be mild to very bad. They often show up in 5-6 days after being infected. But, they can take up to 14 days to appear. Common symptoms are: Cough. Feeling tired. New loss of taste or smell. Fever. Less common symptoms are: Sore throat. Headache. Body or muscle aches. Diarrhea. A skin rash or fingers or toes that are a different color than usual. Red or irritated eyes. Sometimes, COVID-19 does not cause symptoms. How is this diagnosed? COVID-19 can be diagnosed with tests done in the lab or at home. Fluid from your nose, mouth, or lungs will be used to check for the virus. How is this treated? Treatment for COVID-19 depends on how sick you are. Mild symptoms can be treated at home with rest, fluids, and  over-the-counter medicines. very bad symptoms may be treated in a hospital intensive care unit (ICU). If you have symptoms and are at risk of getting very sick, you may be given a medicine that fights viruses. This medicine is called an antiviral. How is this prevented? To protect yourself from COVID-19: Know your risk factors. Get vaccinated. If your body can't fight infections easily, talk to your provider about treatment to help prevent COVID-19. Stay at least about 3 feet (1 meter) away from other people. Wear mask that fits well when: You can't stay at a distance from people. You're in a place with not a lot of air flow. Try to be in open spaces with good air flow when you are in public. Wash your hands often or use an alcohol-based hand sanitizer. Cover your nose and mouth when you cough or sneeze. If you think you have COVID-19 or have been around someone who has it, stay home and away from other people as told by your provider or health officials. Where to find more information To learn more: Go to [employer] Click Health Topics. Type COVID-19 in the search box. Go to [employer].br Click Health Topics. Then click All Topics. Type COVID-19 in the search box. Get help right away if: You have trouble breathing or get short of breath. You have pain or pressure in your chest. You're feeling confused. These symptoms may be an emergency. Get help right away. Call 911. Do not wait to see if the symptoms will go away.  Do not drive yourself to the hospital. This information is not intended to replace advice given to you by your health care provider. Make sure you discuss any questions you have with your health care provider. Document Revised: 05/08/2023 Document Reviewed: 04/30/2023 Elsevier Patient Education  2025 [employer].

## 2024-06-01 NOTE — Progress Notes (Signed)
 There were no vitals taken for this visit.   Subjective:    Patient ID: Brittney Alvarez, female    DOB: August 13, 1971, 53 y.o.   MRN: [id number]  HPI: Brittney Alvarez is a 53 y.o. female  Chief Complaint  Patient presents with   Covid Positive    Tested yesterday. Headaches, draining, chest tightness, runny nose, sore throat, coughing, possible fever yesterday. OTC tylenol  cold and flu.    Virtual Visit via Video Note  I connected with Brittney Alvarez on 06/01/24 at  2:00 PM EDT by a video enabled telemedicine application and verified that I am speaking with the correct person using two identifiers.  Location: Patient: home Provider: work   I discussed the limitations of evaluation and management by telemedicine and the availability of in person appointments. The patient expressed understanding and agreed to proceed.  I discussed the assessment and treatment plan with the patient. The patient was provided an opportunity to ask questions and all were answered. The patient agreed with the plan and demonstrated an understanding of the instructions.   The patient was advised to call back or seek an in-person evaluation if the symptoms worsen or if the condition fails to improve as anticipated.  I provided 25 minutes of non-face-to-face time during this encounter.   Daruis Swaim T Lin Glazier, NP   COVID POSITIVE Presents today for Covid infection. Symptoms started Sunday night (05/30/24). Tested positive for Covid yesterday.  Flu was negative on home test. Would like to start Covid treatment. Fever: has had chills Cough: yes Shortness of breath: no Wheezing: a little bit Chest pain: no Chest tightness: yes Chest congestion: no Nasal congestion: yes Runny nose: yes Post nasal drip: yes Sneezing: no Sore throat: yes Swollen glands: no Sinus pressure: yes Headache: yes Face pain: no Toothache: yes Ear pain: none Ear pressure: none Eyes red/itching:no Eye drainage/crusting: no   Vomiting: no Rash: no Fatigue: yes Sick contacts: no Strep contacts: no  Context: stable Recurrent sinusitis: no Relief with OTC cold/cough medications: yes  Treatments attempted: cold/sinus and mucinex    Relevant past medical, surgical, family and social history reviewed and updated as indicated. Interim medical history since our last visit reviewed. Allergies and medications reviewed and updated.  Review of Systems  Constitutional:  Positive for chills and fatigue. Negative for activity change, appetite change, diaphoresis and fever.  HENT:  Positive for congestion, rhinorrhea, sinus pressure and sore throat. Negative for ear discharge, ear pain, postnasal drip, sinus pain and sneezing.   Respiratory:  Positive for cough, chest tightness and wheezing. Negative for shortness of breath.   Cardiovascular: Negative.   Gastrointestinal: Negative.   Neurological:  Positive for headaches.  Psychiatric/Behavioral: Negative.      Per HPI unless specifically indicated above     Objective:    There were no vitals taken for this visit.  Wt Readings from Last 3 Encounters:  03/22/24 182 lb 12.8 oz (82.9 kg)  09/23/23 189 lb 3.2 oz (85.8 kg)  07/25/23 186 lb 3.2 oz (84.5 kg)    Physical Exam Vitals and nursing note reviewed.  Constitutional:      General: She is awake. She is not in acute distress.    Appearance: She is well-developed. She is ill-appearing. She is not toxic-appearing.  HENT:     Head: Normocephalic.     Right Ear: Hearing normal.     Left Ear: Hearing normal.  Eyes:     General: Lids are normal.  Right eye: No discharge.        Left eye: No discharge.     Conjunctiva/sclera: Conjunctivae normal.  Pulmonary:     Effort: Pulmonary effort is normal. No accessory muscle usage or respiratory distress.  Musculoskeletal:     Cervical back: Normal range of motion.  Neurological:     Mental Status: She is alert and oriented to person, place, and time.   Psychiatric:        Attention and Perception: Attention normal.        Mood and Affect: Mood normal.        Behavior: Behavior normal. Behavior is cooperative.        Thought Content: Thought content normal.        Judgment: Judgment normal.    Results for orders placed or performed in visit on 09/23/23  CBC with Differential/Platelet   Collection Time: 09/23/23 10:02 AM  Result Value Ref Range   WBC 5.5 3.4 - 10.8 x10E3/uL   RBC 5.32 (H) 3.77 - 5.28 x10E6/uL   Hemoglobin 14.8 11.1 - 15.9 g/dL   Hematocrit 54.4 65.9 - 46.6 %   MCV 86 79 - 97 fL   MCH 27.8 26.6 - 33.0 pg   MCHC 32.5 31.5 - 35.7 g/dL   RDW 87.2 88.2 - 84.5 %   Platelets 265 150 - 450 x10E3/uL   Neutrophils 54 Not Estab. %   Lymphs 29 Not Estab. %   Monocytes 11 Not Estab. %   Eos 5 Not Estab. %   Basos 1 Not Estab. %   Neutrophils Absolute 3.0 1.4 - 7.0 x10E3/uL   Lymphocytes Absolute 1.6 0.7 - 3.1 x10E3/uL   Monocytes Absolute 0.6 0.1 - 0.9 x10E3/uL   EOS (ABSOLUTE) 0.3 0.0 - 0.4 x10E3/uL   Basophils Absolute 0.0 0.0 - 0.2 x10E3/uL   Immature Granulocytes 0 Not Estab. %   Immature Grans (Abs) 0.0 0.0 - 0.1 x10E3/uL  Comprehensive metabolic panel   Collection Time: 09/23/23 10:02 AM  Result Value Ref Range   Glucose 76 70 - 99 mg/dL   BUN 11 6 - 24 mg/dL   Creatinine, Ser 9.12 0.57 - 1.00 mg/dL   eGFR 81 >40 fO/fpw/8.26   BUN/Creatinine Ratio 13 9 - 23   Sodium 142 134 - 144 mmol/L   Potassium 4.4 3.5 - 5.2 mmol/L   Chloride 102 96 - 106 mmol/L   CO2 26 20 - 29 mmol/L   Calcium 9.7 8.7 - 10.2 mg/dL   Total Protein 7.1 6.0 - 8.5 g/dL   Albumin 4.1 3.8 - 4.9 g/dL   Globulin, Total 3.0 1.5 - 4.5 g/dL   Bilirubin Total 0.3 0.0 - 1.2 mg/dL   Alkaline Phosphatase 106 44 - 121 IU/L   AST 20 0 - 40 IU/L   ALT 36 (H) 0 - 32 IU/L  Lipid Panel w/o Chol/HDL Ratio   Collection Time: 09/23/23 10:02 AM  Result Value Ref Range   Cholesterol, Total 198 100 - 199 mg/dL   Triglycerides 60 0 - 149 mg/dL   HDL 61  >60 mg/dL   VLDL Cholesterol Cal 11 5 - 40 mg/dL   LDL Chol Calc (NIH) 873 (H) 0 - 99 mg/dL  TSH   Collection Time: 09/23/23 10:02 AM  Result Value Ref Range   TSH 0.793 0.450 - 4.500 uIU/mL  Microscopic Examination   Collection Time: 09/23/23  2:00 PM   Urine  Result Value Ref Range   WBC, UA 6-10 (A) 0 -  5 /hpf   RBC, Urine 0-2 0 - 2 /hpf   Epithelial Cells (non renal) 0-10 0 - 10 /hpf   Bacteria, UA Many (A) None seen/Few  Urinalysis, Routine w reflex microscopic   Collection Time: 09/23/23  2:00 PM  Result Value Ref Range   Specific Gravity, UA 1.020 1.005 - 1.030   pH, UA 6.0 5.0 - 7.5   Color, UA Yellow Yellow   Appearance Ur Cloudy (A) Clear   Leukocytes,UA 3+ (A) Negative   Protein,UA Negative Negative/Trace   Glucose, UA Negative Negative   Ketones, UA Negative Negative   RBC, UA Negative Negative   Bilirubin, UA Negative Negative   Urobilinogen, Ur 0.2 0.2 - 1.0 mg/dL   Nitrite, UA Positive (A) Negative   Microscopic Examination See below:   Urine Culture   Collection Time: 09/23/23  3:10 PM   Specimen: Urine   UR  Result Value Ref Range   Urine Culture, Routine Final report (A)    Organism ID, Bacteria Escherichia coli (A)    Antimicrobial Susceptibility Comment       Assessment & Plan:   Problem List Items Addressed This Visit       Other   Lab test positive for detection of COVID-19 virus - Primary   Acute with symptoms for 3 days, tested positive at home yesterday/negative flu. Would like to start Paxlovid.  Educated her on this treatment and side effects. Sent script in. Last eGFR >60.  Will send in Tussionex to use at night for cough.  Recommend: - Increased rest - Increasing Fluids - Acetaminophen  / ibuprofen  as needed for fever/pain.  - Salt water gargling, chloraseptic spray and throat lozenges - Mucinex.  - Humidifying the air.  - Isolate from family and avoid going into work until symptoms improve        Follow up plan: Return if  symptoms worsen or fail to improve.

## 2024-06-11 ENCOUNTER — Ambulatory Visit
Admission: RE | Admit: 2024-06-11 | Discharge: 2024-06-11 | Disposition: A | Discharge status: Home / Self Care | Source: Ambulatory Visit | Attending: Nurse Practitioner | Admitting: Nurse Practitioner

## 2024-06-11 DIAGNOSIS — Z1231 Encounter for screening mammogram for malignant neoplasm of breast: Secondary | ICD-10-CM

## 2024-06-16 ENCOUNTER — Ambulatory Visit: Payer: Self-pay | Admitting: Nurse Practitioner

## 2024-06-16 NOTE — Progress Notes (Signed)
 Contacted via MyChart   Normal mammogram, may repeat in one year:)

## 2024-06-16 NOTE — Patient Instructions (Signed)
 Perimenopause: What to Know Perimenopause is the time in your life when your levels of estrogen start to go down. Estrogen is the female hormone made by your ovaries. Perimenopause can start 2-8 years before menopause. It can cause changes to your menstrual period. During this time, your ovaries may or may not make an egg. In many cases, you can still get pregnant. What are the causes? Perimenopause is a natural change in your homone levels that happens as you get older. What increases the risk? You're more likely to start perimenopause early if: You have an abnormal growth (tumor) of the pituitary gland in your brain. You have a disease that affects your ovaries. You've had certain treatments for cancer. These include: Chemotherapy. Hormone therapy. Radiation therapy on the area between your hips (pelvis). You smoke a lot or drink a lot of alcohol. Other family members have gone through menopause early. What are the signs or symptoms? Symptoms are unique to each person. You may have: Hot flashes. Irregular periods. Night sweats. Changes in how you feel about sex. You may have less of a sex drive or feel more discomfort around your sexuality. Vaginal dryness. Headaches. Mood swings. Other symptoms may include: Depression. This is when you feel sad or hopeless. Trouble sleeping. Memory problems or trouble focusing. Irritability. This means getting annoyed easily. Tiredness. Weight gain. Anxiety. This is feeling worried or nervous. You can also have trouble getting pregnant. How is this diagnosed? You may be diagnosed based on: Your medical history. An exam. Your age. Your history of menstrual periods. Your symptoms. Hormone tests. How is this treated? In some cases, no treatment is needed. Talk with your health care provider about if you should get treated. Treatments may include: Menopausal hormone therapy (MHT). Medicines to treat certain  symptoms. Acupuncture. Vitamin or herbal supplements. Before you start treatment, let your provider know if you or anyone in your family has or has had: Heart disease. Breast cancer. Blood clots. Diabetes. Osteoporosis. Follow these instructions at home: Eating and drinking  Eat a balanced diet. It should include: Fresh fruits and vegetables. Whole grains. Soybeans. Eggs. Lean meat. Low-fat dairy. To help prevent hot flashes, stay away from: Alcohol. Drinks with caffeine in them. Spicy foods. Lifestyle Do not smoke, vape, or use nicotine or tobacco. Get at least 30 minutes of physical activity on 5 or more days each week. Get 7-8 hours of sleep each night. Dress in layers that can be taken off if you have a hot flash. Find ways to manage stress. You may want to try: Deep breathing. Meditation. Writing in a journal. General instructions  Take your medicines only as told. Keep track of your periods. Track: When they happen. How heavy they are. How long they last. How much time passes between periods. Keep track of your symptoms. Track: When they start. How often you have them. How long they last. Use vaginal lubricants or moisturizers. These can help with: Vaginal dryness. Comfort during sex. You can still get pregnant if you're having any periods. Make sure you use birth control if you don't want to get pregnant. Contact a health care provider if: You have a very heavy period or pass blood clots. Your period lasts more than 2 days longer than normal. Your period comes back sooner than 21 days. You bleed after having sex. You have pain during sex. You have pain when you pee. You get very bad headaches. You have trouble with your eyesight. Get help right away if: You  have chest pain. You have trouble breathing. You have trouble talking. You have very bad depression. This information is not intended to replace advice given to you by your health care provider.  Make sure you discuss any questions you have with your health care provider. Document Revised: 04/10/2023 Document Reviewed: 04/10/2023 Elsevier Patient Education  2024 [employer].

## 2024-06-18 ENCOUNTER — Encounter: Payer: Self-pay | Admitting: Nurse Practitioner

## 2024-06-18 ENCOUNTER — Ambulatory Visit (INDEPENDENT_AMBULATORY_CARE_PROVIDER_SITE_OTHER): Admitting: Nurse Practitioner

## 2024-06-18 VITALS — BP 117/80 | HR 80 | Temp 97.9°F | Resp 15 | Ht 69.8 in | Wt 185.6 lb

## 2024-06-18 DIAGNOSIS — N959 Unspecified menopausal and perimenopausal disorder: Secondary | ICD-10-CM | POA: Diagnosis not present

## 2024-06-18 MED ORDER — ESTRADIOL-LEVONORGESTREL 0.045-0.015 MG/DAY TD PTWK: 1.0000 | MEDICATED_PATCH | TRANSDERMAL | 12 refills | Status: AC

## 2024-06-18 NOTE — Assessment & Plan Note (Addendum)
 Ongoing, we discussed all treatment options, including no treatment. She denies any history of DVT/PE, migraine with aura, cancer, blood clotting disorder or tobacco use. Lower risk. We discussed based on all of her symptoms she may want to consider hormone therapy.  Will see if can get Climara Pro covered for patient, intact uterus - needs estrogen and progesterone.  If unable to get covered will work on oral medication.  Educated her on this plan and medications + risks/side effects.  Goal is to use for the shortest period possible. This treatment may benefit overall symptoms. No cycle in >7 months, we discussed once she reaches one year without cycle she is considered menopausal. Labs today.

## 2024-06-18 NOTE — Progress Notes (Signed)
 BP 117/80 (BP Location: Left Arm, Patient Position: Sitting, Cuff Size: Normal)   Pulse 80   Temp 97.9 F (36.6 C) (Oral)   Resp 15   Ht 5' 9.8 (1.773 m)   Wt 185 lb 9.6 oz (84.2 kg)   LMP  (LMP Unknown)   SpO2 98%   BMI 26.78 kg/m    Subjective:    Patient ID: Brittney Alvarez, female    DOB: 1970-09-07, 53 y.o.   MRN: [id number]  HPI: Brittney Alvarez is a 53 y.o. female  Chief Complaint  Patient presents with   Follow-up    Discuss perimenopause. Last cycle around a year ago. Hot flashes, irritability, gaining weight, fatigue.   MENOPAUSAL SYMPTOMS Follow-up today for menopausal symptoms.  Currently taking Gabapentin  300 MG at night. Would like hormone labs drawn.  No menstrual cycle in almost one year. Gravida/Para: 4/2 Duration: uncontrolled Symptom severity: moderate Hot flashes: yes and fatigue, hard to get up in the morning Night sweats: yes Sleep disturbances: yes Vaginal dryness: yes Dyspareunia:no Decreased libido: a little Emotional lability: yes Stress incontinence: occasional Previous HRT/pharmacotherapy: no Hysterectomy: no GYN surgery:  Absolute Contraindications to Hormonal Therapy:     Undiagnosed vaginal bleeding: no    Breast cancer: no    Endometrial cancer: possibly her maternal grandmother,     Coronary disease: no    Cerebrovascular disease: no    Venous thromboembolic disease: no     Relevant past medical, surgical, family and social history reviewed and updated as indicated. Interim medical history since our last visit reviewed. Allergies and medications reviewed and updated.  Review of Systems  Constitutional:  Negative for activity change, appetite change, diaphoresis, fatigue and fever.  Respiratory:  Negative for cough, chest tightness, shortness of breath and wheezing.   Cardiovascular:  Negative for chest pain, palpitations and leg swelling.  Gastrointestinal: Negative.   Neurological: Negative.   Psychiatric/Behavioral: Negative.       Per HPI unless specifically indicated above     Objective:    BP 117/80 (BP Location: Left Arm, Patient Position: Sitting, Cuff Size: Normal)   Pulse 80   Temp 97.9 F (36.6 C) (Oral)   Resp 15   Ht 5' 9.8 (1.773 m)   Wt 185 lb 9.6 oz (84.2 kg)   LMP  (LMP Unknown)   SpO2 98%   BMI 26.78 kg/m   Wt Readings from Last 3 Encounters:  06/18/24 185 lb 9.6 oz (84.2 kg)  03/22/24 182 lb 12.8 oz (82.9 kg)  09/23/23 189 lb 3.2 oz (85.8 kg)    Physical Exam Vitals and nursing note reviewed.  Constitutional:      General: She is awake. She is not in acute distress.    Appearance: She is well-developed and well-groomed. She is not ill-appearing or toxic-appearing.  HENT:     Head: Normocephalic.     Right Ear: Hearing and external ear normal.     Left Ear: Hearing and external ear normal.  Eyes:     General: Lids are normal.        Right eye: No discharge.        Left eye: No discharge.     Conjunctiva/sclera: Conjunctivae normal.     Pupils: Pupils are equal, round, and reactive to light.  Neck:     Thyroid : No thyromegaly.     Vascular: No carotid bruit.  Cardiovascular:     Rate and Rhythm: Normal rate and regular rhythm.  Heart sounds: Normal heart sounds. No murmur heard.    No gallop.  Pulmonary:     Effort: Pulmonary effort is normal. No accessory muscle usage or respiratory distress.     Breath sounds: Normal breath sounds.  Abdominal:     General: Bowel sounds are normal. There is no distension.     Palpations: Abdomen is soft.     Tenderness: There is no abdominal tenderness.  Musculoskeletal:     Cervical back: Normal range of motion and neck supple.     Right lower leg: No edema.     Left lower leg: No edema.  Lymphadenopathy:     Cervical: No cervical adenopathy.  Skin:    General: Skin is warm and dry.  Neurological:     Mental Status: She is alert and oriented to person, place, and time.     Deep Tendon Reflexes: Reflexes are normal and  symmetric.     Reflex Scores:      Brachioradialis reflexes are 2+ on the right side and 2+ on the left side.      Patellar reflexes are 2+ on the right side and 2+ on the left side. Psychiatric:        Attention and Perception: Attention normal.        Mood and Affect: Mood normal.        Speech: Speech normal.        Behavior: Behavior normal. Behavior is cooperative.        Thought Content: Thought content normal.     Results for orders placed or performed in visit on 09/23/23  CBC with Differential/Platelet   Collection Time: 09/23/23 10:02 AM  Result Value Ref Range   WBC 5.5 3.4 - 10.8 x10E3/uL   RBC 5.32 (H) 3.77 - 5.28 x10E6/uL   Hemoglobin 14.8 11.1 - 15.9 g/dL   Hematocrit 54.4 65.9 - 46.6 %   MCV 86 79 - 97 fL   MCH 27.8 26.6 - 33.0 pg   MCHC 32.5 31.5 - 35.7 g/dL   RDW 87.2 88.2 - 84.5 %   Platelets 265 150 - 450 x10E3/uL   Neutrophils 54 Not Estab. %   Lymphs 29 Not Estab. %   Monocytes 11 Not Estab. %   Eos 5 Not Estab. %   Basos 1 Not Estab. %   Neutrophils Absolute 3.0 1.4 - 7.0 x10E3/uL   Lymphocytes Absolute 1.6 0.7 - 3.1 x10E3/uL   Monocytes Absolute 0.6 0.1 - 0.9 x10E3/uL   EOS (ABSOLUTE) 0.3 0.0 - 0.4 x10E3/uL   Basophils Absolute 0.0 0.0 - 0.2 x10E3/uL   Immature Granulocytes 0 Not Estab. %   Immature Grans (Abs) 0.0 0.0 - 0.1 x10E3/uL  Comprehensive metabolic panel   Collection Time: 09/23/23 10:02 AM  Result Value Ref Range   Glucose 76 70 - 99 mg/dL   BUN 11 6 - 24 mg/dL   Creatinine, Ser 9.12 0.57 - 1.00 mg/dL   eGFR 81 >40 fO/fpw/8.26   BUN/Creatinine Ratio 13 9 - 23   Sodium 142 134 - 144 mmol/L   Potassium 4.4 3.5 - 5.2 mmol/L   Chloride 102 96 - 106 mmol/L   CO2 26 20 - 29 mmol/L   Calcium 9.7 8.7 - 10.2 mg/dL   Total Protein 7.1 6.0 - 8.5 g/dL   Albumin 4.1 3.8 - 4.9 g/dL   Globulin, Total 3.0 1.5 - 4.5 g/dL   Bilirubin Total 0.3 0.0 - 1.2 mg/dL   Alkaline Phosphatase 106 44 -  121 IU/L   AST 20 0 - 40 IU/L   ALT 36 (H) 0 - 32  IU/L  Lipid Panel w/o Chol/HDL Ratio   Collection Time: 09/23/23 10:02 AM  Result Value Ref Range   Cholesterol, Total 198 100 - 199 mg/dL   Triglycerides 60 0 - 149 mg/dL   HDL 61 >60 mg/dL   VLDL Cholesterol Cal 11 5 - 40 mg/dL   LDL Chol Calc (NIH) 873 (H) 0 - 99 mg/dL  TSH   Collection Time: 09/23/23 10:02 AM  Result Value Ref Range   TSH 0.793 0.450 - 4.500 uIU/mL  Microscopic Examination   Collection Time: 09/23/23  2:00 PM   Urine  Result Value Ref Range   WBC, UA 6-10 (A) 0 - 5 /hpf   RBC, Urine 0-2 0 - 2 /hpf   Epithelial Cells (non renal) 0-10 0 - 10 /hpf   Bacteria, UA Many (A) None seen/Few  Urinalysis, Routine w reflex microscopic   Collection Time: 09/23/23  2:00 PM  Result Value Ref Range   Specific Gravity, UA 1.020 1.005 - 1.030   pH, UA 6.0 5.0 - 7.5   Color, UA Yellow Yellow   Appearance Ur Cloudy (A) Clear   Leukocytes,UA 3+ (A) Negative   Protein,UA Negative Negative/Trace   Glucose, UA Negative Negative   Ketones, UA Negative Negative   RBC, UA Negative Negative   Bilirubin, UA Negative Negative   Urobilinogen, Ur 0.2 0.2 - 1.0 mg/dL   Nitrite, UA Positive (A) Negative   Microscopic Examination See below:   Urine Culture   Collection Time: 09/23/23  3:10 PM   Specimen: Urine   UR  Result Value Ref Range   Urine Culture, Routine Final report (A)    Organism ID, Bacteria Escherichia coli (A)    Antimicrobial Susceptibility Comment       Assessment & Plan:   Problem List Items Addressed This Visit       Other   Menopausal disorder - Primary   Ongoing, we discussed all treatment options, including no treatment. She denies any history of DVT/PE, migraine with aura, cancer, blood clotting disorder or tobacco use. Lower risk. We discussed based on all of her symptoms she may want to consider hormone therapy.  Will see if can get Climara Pro covered for patient, intact uterus - needs estrogen and progesterone.  If unable to get covered will work on  oral medication.  Educated her on this plan and medications + risks/side effects.  Goal is to use for the shortest period possible. This treatment may benefit overall symptoms. No cycle in >7 months, we discussed once she reaches one year without cycle she is considered menopausal. Labs today.      Relevant Orders   Estradiol   FSH/LH   CBC with Differential/Platelet   TSH     Follow up plan: Return in about 5 weeks (around 07/23/2024) for Menopause -- started hormone therapy on 06/18/24 (can be virtual).

## 2024-06-19 ENCOUNTER — Ambulatory Visit: Payer: Self-pay | Admitting: Nurse Practitioner

## 2024-06-19 LAB — CBC WITH DIFFERENTIAL/PLATELET
Basophils Absolute: 0 x10E3/uL (ref 0.0–0.2)
Basos: 1 %
EOS (ABSOLUTE): 0.2 x10E3/uL (ref 0.0–0.4)
Eos: 4 %
Hematocrit: 44.3 % (ref 34.0–46.6)
Hemoglobin: 14.3 g/dL (ref 11.1–15.9)
Immature Grans (Abs): 0 x10E3/uL (ref 0.0–0.1)
Immature Granulocytes: 0 %
Lymphocytes Absolute: 1.5 x10E3/uL (ref 0.7–3.1)
Lymphs: 27 %
MCH: 28.1 pg (ref 26.6–33.0)
MCHC: 32.3 g/dL (ref 31.5–35.7)
MCV: 87 fL (ref 79–97)
Monocytes Absolute: 0.4 x10E3/uL (ref 0.1–0.9)
Monocytes: 8 %
Neutrophils Absolute: 3.2 x10E3/uL (ref 1.4–7.0)
Neutrophils: 60 %
Platelets: 263 x10E3/uL (ref 150–450)
RBC: 5.08 x10E6/uL (ref 3.77–5.28)
RDW: 12.9 % (ref 11.7–15.4)
WBC: 5.3 x10E3/uL (ref 3.4–10.8)

## 2024-06-19 LAB — ESTRADIOL: Estradiol: 56.3 pg/mL

## 2024-06-19 LAB — TSH: TSH: 1.21 u[IU]/mL (ref 0.450–4.500)

## 2024-06-19 LAB — FSH/LH
FSH: 63 m[IU]/mL
LH: 43.2 m[IU]/mL

## 2024-06-19 NOTE — Progress Notes (Signed)
 Contacted via MyChart  Good morning Brittney Alvarez, your labs have returned and based on these it appears you are post menopausal.  Once you get to a year of no cycles you should not have anymore. Remainder of labs stable.  Any questions?

## 2024-06-21 ENCOUNTER — Encounter: Payer: Self-pay | Admitting: Radiology

## 2024-07-17 NOTE — Patient Instructions (Signed)
 Menopause: What to Know Menopause is the time in your life when your menstrual periods stop. It marks the end of your ability to get pregnant. It can be defined as not having a period for 12 months without another medical cause. The time when you start to move into menopause is called perimenopause. It often happens between ages 67-55. It can last for many years. During perimenopause, hormone levels change in your body. This can cause symptoms and affect your health. Menopause may make you more likely to have: Bones that are weak and break more easily. Depression. This is when you feel sad or hopeless. Arteries that harden and get narrow. These can cause heart attacks and strokes. What are the causes? In most cases, menopause is a natural change to your body and hormone levels that happens as you get older. But in some cases, it may be caused by changes that aren't natural. These include: Surgery to take out both ovaries. Side effects from some medicines. What increases the risk? You're more likely to go through menopause early if: You have an abnormal growth (tumor) of the pituitary gland in your brain. You have a disease that affects your ovaries. You've had certain treatments for cancer. These include: Chemotherapy. Hormone therapy. Radiation therapy on the area between your hips (pelvis). You smoke a lot or drink a lot of alcohol. Other people in your family have gone through menopause early. You're very thin. What are the signs or symptoms? You may have: Hot flashes. Irregular periods. Night sweats. Changes in how you feel about sex. You may: Have less of a sex drive. Feel more discomfort around your sexuality. Vaginal dryness and thinning of the vaginal walls. This may make it hurt to have sex. Skin changes, such as: Dry skin. New wrinkles. Headaches. Other symptoms may include: Trouble sleeping. Mood swings. Memory problems. Weight gain. Hair growth on your face and  chest. Bladder infections or trouble peeing. How is this diagnosed? You may be diagnosed based on: Your medical history. An exam. Your age. Your history of menstrual periods. Your symptoms. Hormone tests. How is this treated? In some cases, no treatment is needed. Talk with your health care provider about if you should get treated. Treatments may include: Menopausal hormone therapy (MHT). Medicines to treat certain symptoms. Acupuncture. Vitamin or herbal supplements. Before you start treatment, let your provider know if you or anyone in your family has or has had: Heart disease. Breast cancer. Blood clots. Diabetes. Osteoporosis. Follow these instructions at home: Eating and drinking  Eat a balanced diet. It should include: Fresh fruits and vegetables. Whole grains. Lean protein. Low-fat dairy. Eat lots of foods that have calcium and vitamin D in them. These can help keep your bones healthy. Foods and drinks that are rich in calcium include: Yogurt and low-fat milk. Beans. Almonds. Sardines. Broccoli and kale. To help prevent hot flashes, stay away from: Alcohol. Drinks with caffeine in them. Spicy foods. Lifestyle Do not smoke, vape, or use nicotine or tobacco. Get 7-8 hours of sleep each night. If you have hot flashes, you may want to: Dress in layers. Avoid things that may trigger hot flashes, like warm places or stress. Take slow, deep breaths when a hot flash starts. Keep a fan in your home and office. Find ways to manage stress. You may want to try: Deep breathing. Meditation. Writing in a journal. Ask your provider about going to group therapy. Therapy can help you get support from others who are going  through menopause. General instructions  Talk with your provider before you take any herbal supplements. Keep track of your symptoms. Track: When they start. How often you have them. How long they last. Use vaginal lubricants or moisturizers. These  can help with: Vaginal dryness. Comfort during sex. Contact a health care provider if: You're older than 55 and still get periods. You have pain during sex. You haven't had a period for 12 months and then start to bleed from your vagina. It hurts to pee. You get very bad headaches. Get help right away if: You're very depressed. You have a lot of bleeding from your vagina. Your heart is beating too fast. You have very bad belly pain or indigestion that doesn't go away with medicines. This information is not intended to replace advice given to you by your health care provider. Make sure you discuss any questions you have with your health care provider. Document Revised: 04/10/2023 Document Reviewed: 04/10/2023 Elsevier Patient Education  2024 [employer].

## 2024-07-20 ENCOUNTER — Telehealth: Admitting: Nurse Practitioner

## 2024-07-20 ENCOUNTER — Encounter: Payer: Self-pay | Admitting: Nurse Practitioner

## 2024-07-20 DIAGNOSIS — N959 Unspecified menopausal and perimenopausal disorder: Secondary | ICD-10-CM

## 2024-07-20 MED ORDER — ALBUTEROL SULFATE HFA 108 (90 BASE) MCG/ACT IN AERS: 2.0000 | INHALATION_SPRAY | Freq: Four times a day (QID) | RESPIRATORY_TRACT | 3 refills | Status: AC | PRN

## 2024-07-20 NOTE — Assessment & Plan Note (Addendum)
 Ongoing and improved. Will continue ClimaraPro which has offered her a lot of benefit.  She denies any history of DVT/PE, migraine with aura, cancer, blood clotting disorder or tobacco use. Lower risk. Educated her on this plan and medications + risks/side effects. Goal is to use for the shortest period possible. This treatment may benefit overall symptoms. No cycle in >7 months, we discussed once she reaches one year without cycle she is considered menopausal.

## 2024-07-20 NOTE — Progress Notes (Signed)
 There were no vitals taken for this visit.   Subjective:    Patient ID: Brittney Alvarez, female    DOB: [birth year], 53 y.o.   MRN: [id number]  HPI: Brittney Alvarez is a 53 y.o. female  Chief Complaint  Patient presents with   Menopause    Feels better since starting. Had some days of fatigue.    Virtual Visit via Video Note  I connected with Brittney Alvarez on 07/20/24 at  4:20 PM EST by a video enabled telemedicine application and verified that I am speaking with the correct person using two identifiers.  Location: Patient: home Provider: work   I discussed the limitations of evaluation and management by telemedicine and the availability of in person appointments. The patient expressed understanding and agreed to proceed.  I discussed the assessment and treatment plan with the patient. The patient was provided an opportunity to ask questions and all were answered. The patient agreed with the plan and demonstrated an understanding of the instructions.   The patient was advised to call back or seek an in-person evaluation if the symptoms worsen or if the condition fails to improve as anticipated.  I provided 25 minutes of non-face-to-face time during this encounter.   Saragrace Selke T Adib Wahba, NP   MENOPAUSAL SYMPTOMS Follow-up today for menopausal symptoms, started on ClimaraPro patch on 06/18/24 which has much improved symptoms. Stopped taking Gabapentin  at night. No menstrual cycle in almost one year. Gravida/Para: 4/2 Duration: controlled Symptom severity: moderate Hot flashes: improved Night sweats: improved Sleep disturbances: improved Vaginal dryness: no Dyspareunia:no Decreased libido: improved Emotional lability:  much improved Stress incontinence: occasional Previous HRT/pharmacotherapy: no Hysterectomy: no GYN surgery:  Absolute Contraindications to Hormonal Therapy:     Undiagnosed vaginal bleeding: no    Breast cancer: no    Endometrial cancer: possibly her maternal  grandmother,     Coronary disease: no    Cerebrovascular disease: no    Venous thromboembolic disease: no    Relevant past medical, surgical, family and social history reviewed and updated as indicated. Interim medical history since our last visit reviewed. Allergies and medications reviewed and updated.  Review of Systems  Constitutional:  Negative for activity change, appetite change, diaphoresis, fatigue and fever.  Respiratory:  Negative for cough, chest tightness, shortness of breath and wheezing.   Cardiovascular:  Negative for chest pain, palpitations and leg swelling.  Gastrointestinal: Negative.   Neurological: Negative.   Psychiatric/Behavioral: Negative.      Per HPI unless specifically indicated above     Objective:    There were no vitals taken for this visit.  Wt Readings from Last 3 Encounters:  06/18/24 185 lb 9.6 oz (84.2 kg)  03/22/24 182 lb 12.8 oz (82.9 kg)  09/23/23 189 lb 3.2 oz (85.8 kg)    Physical Exam Vitals and nursing note reviewed.  Constitutional:      General: She is awake. She is not in acute distress.    Appearance: She is well-developed. She is not ill-appearing.  HENT:     Head: Normocephalic.     Right Ear: Hearing normal.     Left Ear: Hearing normal.  Eyes:     General: Lids are normal.        Right eye: No discharge.        Left eye: No discharge.     Conjunctiva/sclera: Conjunctivae normal.  Pulmonary:     Effort: Pulmonary effort is normal. No accessory muscle usage or respiratory distress.  Musculoskeletal:     Cervical back: Normal range of motion.  Neurological:     Mental Status: She is alert and oriented to person, place, and time.  Psychiatric:        Attention and Perception: Attention normal.        Mood and Affect: Mood normal.        Behavior: Behavior normal. Behavior is cooperative.        Thought Content: Thought content normal.        Judgment: Judgment normal.     Results for orders placed or performed in  visit on 06/18/24  Estradiol    Collection Time: 06/18/24  9:43 AM  Result Value Ref Range   Estradiol  56.3 pg/mL  FSH/LH   Collection Time: 06/18/24  9:43 AM  Result Value Ref Range   LH 43.2 mIU/mL   FSH 63.0 mIU/mL  CBC with Differential/Platelet   Collection Time: 06/18/24  9:43 AM  Result Value Ref Range   WBC 5.3 3.4 - 10.8 x10E3/uL   RBC 5.08 3.77 - 5.28 x10E6/uL   Hemoglobin 14.3 11.1 - 15.9 g/dL   Hematocrit 55.6 65.9 - 46.6 %   MCV 87 79 - 97 fL   MCH 28.1 26.6 - 33.0 pg   MCHC 32.3 31.5 - 35.7 g/dL   RDW 87.0 88.2 - 84.5 %   Platelets 263 150 - 450 x10E3/uL   Neutrophils 60 Not Estab. %   Lymphs 27 Not Estab. %   Monocytes 8 Not Estab. %   Eos 4 Not Estab. %   Basos 1 Not Estab. %   Neutrophils Absolute 3.2 1.4 - 7.0 x10E3/uL   Lymphocytes Absolute 1.5 0.7 - 3.1 x10E3/uL   Monocytes Absolute 0.4 0.1 - 0.9 x10E3/uL   EOS (ABSOLUTE) 0.2 0.0 - 0.4 x10E3/uL   Basophils Absolute 0.0 0.0 - 0.2 x10E3/uL   Immature Granulocytes 0 Not Estab. %   Immature Grans (Abs) 0.0 0.0 - 0.1 x10E3/uL  TSH   Collection Time: 06/18/24  9:43 AM  Result Value Ref Range   TSH 1.210 0.450 - 4.500 uIU/mL      Assessment & Plan:   Problem List Items Addressed This Visit       Other   Menopausal disorder - Primary   Ongoing and improved. Will continue ClimaraPro which has offered her a lot of benefit.  She denies any history of DVT/PE, migraine with aura, cancer, blood clotting disorder or tobacco use. Lower risk. Educated her on this plan and medications + risks/side effects. Goal is to use for the shortest period possible. This treatment may benefit overall symptoms. No cycle in >7 months, we discussed once she reaches one year without cycle she is considered menopausal.         Follow up plan: Return in about 2 months (around 09/23/2024) for Annual Physical.

## 2024-07-23 ENCOUNTER — Telehealth: Payer: Self-pay | Admitting: Nurse Practitioner

## 2024-07-23 NOTE — Telephone Encounter (Signed)
 Copied from CRM #[id number]. Topic: Clinical - Prescription Issue >> Jul 23, 2024  3:27 PM Nathanel BROCKS wrote: Reason for CRM: albuterol  (VENTOLIN  HFA) 108 (90 Base) MCG/ACT inhaler   Insurance will not cover it needs to be a generic brand. Please send this over to the rx.

## 2024-07-26 NOTE — Telephone Encounter (Signed)
 Spoke with pharmacist. They have corrected the issue and are working on the prescription now. Should be available soon for pickup.

## 2024-09-21 ENCOUNTER — Encounter: Payer: Self-pay | Admitting: Nurse Practitioner

## 2024-09-23 ENCOUNTER — Other Ambulatory Visit: Payer: Self-pay | Admitting: Medical Genetics

## 2024-09-28 ENCOUNTER — Encounter: Admitting: Nurse Practitioner

## 2024-10-04 ENCOUNTER — Other Ambulatory Visit
